# Patient Record
Sex: Female | Born: 1970 | Race: White | Hispanic: No | Marital: Married | Smoking: Never smoker
Health system: Southern US, Community
[De-identification: ages and names within clinical notes are randomized; demographics above are authoritative.]

## PROBLEM LIST (undated history)

## (undated) DIAGNOSIS — E669 Obesity, unspecified: Secondary | ICD-10-CM

## (undated) DIAGNOSIS — J9383 Other pneumothorax: Secondary | ICD-10-CM

## (undated) DIAGNOSIS — R011 Cardiac murmur, unspecified: Secondary | ICD-10-CM

## (undated) DIAGNOSIS — R112 Nausea with vomiting, unspecified: Secondary | ICD-10-CM

## (undated) DIAGNOSIS — Z8742 Personal history of other diseases of the female genital tract: Secondary | ICD-10-CM

## (undated) DIAGNOSIS — T8859XA Other complications of anesthesia, initial encounter: Secondary | ICD-10-CM

## (undated) DIAGNOSIS — R42 Dizziness and giddiness: Secondary | ICD-10-CM

## (undated) DIAGNOSIS — T884XXA Failed or difficult intubation, initial encounter: Secondary | ICD-10-CM

## (undated) DIAGNOSIS — J302 Other seasonal allergic rhinitis: Secondary | ICD-10-CM

## (undated) DIAGNOSIS — Z9889 Other specified postprocedural states: Secondary | ICD-10-CM

## (undated) DIAGNOSIS — T4145XA Adverse effect of unspecified anesthetic, initial encounter: Secondary | ICD-10-CM

## (undated) HISTORY — PX: LUNG SURGERY: SHX703

## (undated) HISTORY — DX: Cardiac murmur, unspecified: R01.1

## (undated) HISTORY — DX: Other pneumothorax: J93.83

## (undated) HISTORY — DX: Personal history of other diseases of the female genital tract: Z87.42

## (undated) HISTORY — DX: Obesity, unspecified: E66.9

## (undated) HISTORY — DX: Other seasonal allergic rhinitis: J30.2

## (undated) SURGERY — Surgical Case
Anesthesia: *Unknown

---
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
## 2010-04-18 HISTORY — PX: ABLATION ON ENDOMETRIOSIS: SHX5787

## 2010-08-18 DIAGNOSIS — N921 Excessive and frequent menstruation with irregular cycle: Secondary | ICD-10-CM | POA: Insufficient documentation

## 2012-03-07 ENCOUNTER — Ambulatory Visit: Payer: Self-pay | Admitting: Nurse Practitioner

## 2013-04-15 ENCOUNTER — Ambulatory Visit: Payer: Self-pay | Admitting: Nurse Practitioner

## 2013-11-25 DIAGNOSIS — R42 Dizziness and giddiness: Secondary | ICD-10-CM | POA: Insufficient documentation

## 2013-11-25 DIAGNOSIS — K219 Gastro-esophageal reflux disease without esophagitis: Secondary | ICD-10-CM | POA: Insufficient documentation

## 2014-05-02 ENCOUNTER — Ambulatory Visit: Payer: Self-pay | Admitting: Family Medicine

## 2014-12-26 LAB — HEPATIC FUNCTION PANEL
ALT: 14 (ref 7–35)
AST: 17 (ref 13–35)

## 2014-12-26 LAB — BASIC METABOLIC PANEL
BUN: 11 (ref 4–21)
CREATININE: 0.8 (ref <=1.1)
GLUCOSE: 82
Potassium: 4.4 (ref 3.4–5.3)
SODIUM: 140 (ref 137–147)

## 2014-12-26 LAB — LIPID PANEL
Cholesterol: 161 (ref 0–200)
HDL: 45 (ref 35–70)
LDL Cholesterol: 108
Triglycerides: 39 — AB (ref 40–160)

## 2014-12-26 LAB — HM PAP SMEAR: HM Pap smear: NORMAL

## 2015-07-24 DIAGNOSIS — Z1283 Encounter for screening for malignant neoplasm of skin: Secondary | ICD-10-CM | POA: Diagnosis not present

## 2015-07-24 DIAGNOSIS — D239 Other benign neoplasm of skin, unspecified: Secondary | ICD-10-CM | POA: Diagnosis not present

## 2015-07-24 DIAGNOSIS — D229 Melanocytic nevi, unspecified: Secondary | ICD-10-CM | POA: Diagnosis not present

## 2015-07-24 DIAGNOSIS — D485 Neoplasm of uncertain behavior of skin: Secondary | ICD-10-CM | POA: Diagnosis not present

## 2015-09-16 DIAGNOSIS — J029 Acute pharyngitis, unspecified: Secondary | ICD-10-CM | POA: Diagnosis not present

## 2016-03-17 ENCOUNTER — Ambulatory Visit (INDEPENDENT_AMBULATORY_CARE_PROVIDER_SITE_OTHER): Payer: BLUE CROSS/BLUE SHIELD | Admitting: Physician Assistant

## 2016-03-17 ENCOUNTER — Encounter: Payer: Self-pay | Admitting: Physician Assistant

## 2016-03-17 VITALS — BP 128/80 | HR 72 | Temp 98.2°F | Resp 16 | Ht 64.0 in | Wt 183.0 lb

## 2016-03-17 DIAGNOSIS — F331 Major depressive disorder, recurrent, moderate: Secondary | ICD-10-CM | POA: Diagnosis not present

## 2016-03-17 DIAGNOSIS — Z1239 Encounter for other screening for malignant neoplasm of breast: Secondary | ICD-10-CM

## 2016-03-17 DIAGNOSIS — Z1231 Encounter for screening mammogram for malignant neoplasm of breast: Secondary | ICD-10-CM | POA: Diagnosis not present

## 2016-03-17 DIAGNOSIS — Z7689 Persons encountering health services in other specified circumstances: Secondary | ICD-10-CM | POA: Diagnosis not present

## 2016-03-17 MED ORDER — ESCITALOPRAM OXALATE 10 MG PO TABS: ORAL_TABLET | ORAL | 0 refills | Status: DC

## 2016-03-17 NOTE — Progress Notes (Signed)
Patient: Tracy Booth Female    DOB: 11-22-1970   45 y.o.   MRN: [id number] Visit Date: 03/17/2016  Today's Provider: Mar Daring, PA-C   Chief Complaint  Patient presents with  . New Patient (Initial Visit)  . Depression   Subjective:     Patient comes in today to establish care with the practice. Patient was formerly a patient of [facility] in [place] (Dr. Baldemar Lenis).   Depression         This is a recurrent problem.  The onset quality is gradual.   The problem occurs daily.  The problem has been gradually worsening since onset.  Associated symptoms include fatigue, insomnia, appetite change and sad.  Associated symptoms include no suicidal ideas.     The symptoms are aggravated by family issues.  Past treatments include nothing.  No Known Allergies  No current outpatient prescriptions on file.  Review of Systems  Constitutional: Positive for appetite change and fatigue. Negative for activity change, chills, diaphoresis, fever and unexpected weight change.  HENT: Negative.   Eyes: Negative.   Respiratory: Negative.   Cardiovascular: Negative.   Gastrointestinal: Negative.   Endocrine: Negative.   Genitourinary: Negative.   Musculoskeletal: Negative.   Skin: Negative.   Allergic/Immunologic: Positive for environmental allergies.  Neurological: Positive for dizziness.  Hematological: Negative.   Psychiatric/Behavioral: Positive for agitation, depression and sleep disturbance. Negative for suicidal ideas. The patient is nervous/anxious and has insomnia.     Social History  Substance Use Topics  . Smoking status: Never Smoker  . Smokeless tobacco: Not on file  . Alcohol use No   Objective:   BP 128/80 (BP Location: Left Arm, Patient Position: Sitting, Cuff Size: Normal)   Pulse 72   Temp 98.2 F (36.8 C)   Resp 16   Ht 5\' 4"  (1.626 m)   Wt 183 lb (83 kg)   LMP  (LMP Unknown)   BMI 31.41 kg/m   Physical Exam  Constitutional: She is  oriented to person, place, and time. She appears well-developed and well-nourished. No distress.  HENT:  Head: Normocephalic and atraumatic.  Right Ear: External ear normal.  Left Ear: External ear normal.  Nose: Nose normal.  Mouth/Throat: Oropharynx is clear and moist. No oropharyngeal exudate.  Eyes: Conjunctivae and EOM are normal. Pupils are equal, round, and reactive to light. Right eye exhibits no discharge. Left eye exhibits no discharge. No scleral icterus.  Neck: Normal range of motion. Neck supple. No JVD present. No tracheal deviation present. No thyromegaly present.  Cardiovascular: Normal rate, regular rhythm, normal heart sounds and intact distal pulses.  Exam reveals no gallop and no friction rub.   No murmur heard. Pulmonary/Chest: Effort normal and breath sounds normal. No respiratory distress. She has no wheezes. She has no rales. She exhibits no tenderness.  Abdominal: Soft. Bowel sounds are normal. She exhibits no distension and no mass. There is no tenderness. There is no rebound and no guarding.  Musculoskeletal: Normal range of motion. She exhibits no edema or tenderness.  Lymphadenopathy:    She has no cervical adenopathy.  Neurological: She is alert and oriented to person, place, and time.  Skin: Skin is warm and dry. No rash noted. She is not diaphoretic.  Psychiatric: Her speech is normal and behavior is normal. Judgment and thought content normal. Her mood appears anxious. Cognition and memory are normal. She exhibits a depressed mood.  Tearful, noticing strain on marriage  Vitals reviewed.  Assessment & Plan:     1. Establishing care with new doctor, encounter for Establishing from Dr. Baldemar Lenis, [facility]. Notes, labs and pap from 12/2014 are all visible and were reviewed today. Patient did have labs in the last 2 weeks for insurance biometric screen. These were not available.  2. Moderate episode of recurrent major depressive disorder  (HCC) Worsening. Will add lexapro as below. She and her husband are starting counseling next week. I will see her back in 4 weeks to see how she is doing with this medication. - escitalopram (LEXAPRO) 10 MG tablet; Take 1/2 tab PO q h.s. X 1 week, then increase to 1 tab PO q h.s.  Dispense: 30 tablet; Refill: 0  3. Breast cancer screening Mammogram ordered as below due to her missing her mammogram in January of this year. - MM Digital Screening; Future  The entirety of the information documented in the History of Present Illness, Review of Systems and Physical Exam were personally obtained by me. Portions of this information were initially documented by Wilburt Finlay, CMA and reviewed by me for thoroughness and accuracy.      Mar Daring, PA-C  [facility]

## 2016-03-17 NOTE — Patient Instructions (Signed)
Escitalopram tablets What is this medicine? ESCITALOPRAM (es sye TAL oh pram) is used to treat depression and certain types of anxiety. This medicine may be used for other purposes; ask your health care provider or pharmacist if you have questions. COMMON BRAND NAME(S): Lexapro What should I tell my health care provider before I take this medicine? They need to know if you have any of these conditions: -bipolar disorder or a family history of bipolar disorder -diabetes -glaucoma -heart disease -kidney or liver disease -receiving electroconvulsive therapy -seizures (convulsions) -suicidal thoughts, plans, or attempt by you or a family member -an unusual or allergic reaction to escitalopram, the related drug citalopram, other medicines, foods, dyes, or preservatives -pregnant or trying to become pregnant -breast-feeding How should I use this medicine? Take this medicine by mouth with a glass of water. Follow the directions on the prescription label. You can take it with or without food. If it upsets your stomach, take it with food. Take your medicine at regular intervals. Do not take it more often than directed. Do not stop taking this medicine suddenly except upon the advice of your doctor. Stopping this medicine too quickly may cause serious side effects or your condition may worsen. A special MedGuide will be given to you by the pharmacist with each prescription and refill. Be sure to read this information carefully each time. Talk to your pediatrician regarding the use of this medicine in children. Special care may be needed. Overdosage: If you think you have taken too much of this medicine contact a poison control center or emergency room at once. NOTE: This medicine is only for you. Do not share this medicine with others. What if I miss a dose? If you miss a dose, take it as soon as you can. If it is almost time for your next dose, take only that dose. Do not take double or extra  doses. What may interact with this medicine? Do not take this medicine with any of the following medications: -certain medicines for fungal infections like fluconazole, itraconazole, ketoconazole, posaconazole, voriconazole -cisapride -citalopram -dofetilide -dronedarone -linezolid -MAOIs like Carbex, Eldepryl, Marplan, Nardil, and Parnate -methylene blue (injected into a vein) -pimozide -thioridazine -ziprasidone This medicine may also interact with the following medications: -alcohol -amphetamines -aspirin and aspirin-like medicines -carbamazepine -certain medicines for depression, anxiety, or psychotic disturbances -certain medicines for migraine headache like almotriptan, eletriptan, frovatriptan, naratriptan, rizatriptan, sumatriptan, zolmitriptan -certain medicines for sleep -certain medicines that treat or prevent blood clots like warfarin, enoxaparin, dalteparin -cimetidine -diuretics -fentanyl -furazolidone -isoniazid -lithium -metoprolol -NSAIDs, medicines for pain and inflammation, like ibuprofen or naproxen -other medicines that prolong the QT interval (cause an abnormal heart rhythm) -procarbazine -rasagiline -supplements like St. John's wort, kava kava, valerian -tramadol -tryptophan This list may not describe all possible interactions. Give your health care provider a list of all the medicines, herbs, non-prescription drugs, or dietary supplements you use. Also tell them if you smoke, drink alcohol, or use illegal drugs. Some items may interact with your medicine. What should I watch for while using this medicine? Tell your doctor if your symptoms do not get better or if they get worse. Visit your doctor or health care professional for regular checks on your progress. Because it may take several weeks to see the full effects of this medicine, it is important to continue your treatment as prescribed by your doctor. Patients and their families should watch out for  new or worsening thoughts of suicide or depression. Also watch out for   sudden changes in feelings such as feeling anxious, agitated, panicky, irritable, hostile, aggressive, impulsive, severely restless, overly excited and hyperactive, or not being able to sleep. If this happens, especially at the beginning of treatment or after a change in dose, call your health care professional. Dennis Bast may get drowsy or dizzy. Do not drive, use machinery, or do anything that needs mental alertness until you know how this medicine affects you. Do not stand or sit up quickly, especially if you are an older patient. This reduces the risk of dizzy or fainting spells. Alcohol may interfere with the effect of this medicine. Avoid alcoholic drinks. Your mouth may get dry. Chewing sugarless gum or sucking hard candy, and drinking plenty of water may help. Contact your doctor if the problem does not go away or is severe. What side effects may I notice from receiving this medicine? Side effects that you should report to your doctor or health care professional as soon as possible: -allergic reactions like skin rash, itching or hives, swelling of the face, lips, or tongue -anxious -black, tarry stools -changes in vision -confusion -elevated mood, decreased need for sleep, racing thoughts, impulsive behavior -eye pain -fast, irregular heartbeat -feeling faint or lightheaded, falls -feeling agitated, angry, or irritable -hallucination, loss of contact with reality -loss of balance or coordination -loss of memory -painful or prolonged erections -restlessness, pacing, inability to keep still -seizures -stiff muscles -suicidal thoughts or other mood changes -trouble sleeping -unusual bleeding or bruising -unusually weak or tired -vomiting Side effects that usually do not require medical attention (report to your doctor or health care professional if they continue or are bothersome): -changes in appetite -change in sex  drive or performance -headache -increased sweating -indigestion, nausea -tremors This list may not describe all possible side effects. Call your doctor for medical advice about side effects. You may report side effects to FDA at 1-800-FDA-1088. Where should I keep my medicine? Keep out of reach of children. Store at room temperature between 15 and 30 degrees C (59 and 86 degrees F). Throw away any unused medicine after the expiration date. NOTE: This sheet is a summary. It may not cover all possible information. If you have questions about this medicine, talk to your doctor, pharmacist, or health care provider.  2017 Elsevier/Gold Standard (2015-09-07 13:20:23)

## 2016-04-15 ENCOUNTER — Encounter: Payer: Self-pay | Admitting: Physician Assistant

## 2016-04-15 ENCOUNTER — Ambulatory Visit (INDEPENDENT_AMBULATORY_CARE_PROVIDER_SITE_OTHER): Payer: BLUE CROSS/BLUE SHIELD | Admitting: Physician Assistant

## 2016-04-15 VITALS — BP 106/80 | HR 64 | Temp 98.1°F | Resp 16 | Wt 178.0 lb

## 2016-04-15 DIAGNOSIS — F3341 Major depressive disorder, recurrent, in partial remission: Secondary | ICD-10-CM | POA: Diagnosis not present

## 2016-04-15 MED ORDER — NORETHINDRONE ACETATE 5 MG PO TABS: 5.0000 mg | ORAL_TABLET | Freq: Every day | ORAL | 3 refills | Status: DC

## 2016-04-15 MED ORDER — ESCITALOPRAM OXALATE 10 MG PO TABS
10.0000 mg | ORAL_TABLET | Freq: Every day | ORAL | 0 refills | Status: DC
Start: 2016-04-15 — End: 2016-05-10

## 2016-04-15 NOTE — Progress Notes (Signed)
       Patient: Tracy Booth Female    DOB: 05/03/1970   45 y.o.   MRN: [id number] Visit Date: 04/15/2016  Today's Provider: Mar Daring, PA-C   Chief Complaint  Patient presents with  . Depression   Subjective:    Depression         Episode onset: FU from 03/17/2016. Pt was started on Lexapro at [place].   The problem has been gradually improving (pt reports she is 30-40% improved) since onset.  Associated symptoms include restlessness, body aches (lower back) and indigestion.  Associated symptoms include no decreased concentration, no fatigue, no helplessness, no hopelessness, does not have insomnia, not irritable, no decreased interest, no appetite change, no headaches, not sad and no suicidal ideas.  Past treatments include SSRIs - Selective serotonin reuptake inhibitors.  Compliance with treatment is good.  Previous treatment provided moderate relief.  Pt is also requesting refill of norethindrone.  No Known Allergies   Current Outpatient Prescriptions:  .  escitalopram (LEXAPRO) 10 MG tablet, Take 1/2 tab PO q h.s. X 1 week, then increase to 1 tab PO q h.s., Disp: 30 tablet, Rfl: 0 .  norethindrone (AYGESTIN) 5 MG tablet, Take 5 mg by mouth daily., Disp: , Rfl:   Review of Systems  Constitutional: Negative for appetite change and fatigue.  Respiratory: Negative for chest tightness and shortness of breath.   Cardiovascular: Negative for chest pain, palpitations and leg swelling.  Neurological: Negative for dizziness and headaches.  Psychiatric/Behavioral: Positive for depression. Negative for decreased concentration, self-injury, sleep disturbance and suicidal ideas. The patient is not nervous/anxious and does not have insomnia.     Social History  Substance Use Topics  . Smoking status: Never Smoker  . Smokeless tobacco: Never Used  . Alcohol use Yes     Comment: rare   Objective:   BP 106/80 (BP Location: Left Arm, Patient Position: Sitting, Cuff Size:  Large)   Pulse 64   Temp 98.1 F (36.7 C) (Oral)   Resp 16   Wt 178 lb (80.7 kg)   LMP  (LMP Unknown)   BMI 30.55 kg/m   Physical Exam  Constitutional: She appears well-developed and well-nourished. She is not irritable. No distress.  Neck: Normal range of motion. Neck supple.  Cardiovascular: Normal rate, regular rhythm and normal heart sounds.  Exam reveals no gallop and no friction rub.   No murmur heard. Pulmonary/Chest: Effort normal and breath sounds normal. No respiratory distress. She has no wheezes. She has no rales.  Skin: She is not diaphoretic.  Psychiatric: She has a normal mood and affect. Her behavior is normal. Judgment and thought content normal.  Vitals reviewed.      Assessment & Plan:     1. Recurrent major depressive disorder, in partial remission ([place]) Slowly improving. Will continue current dose. She is to call the office in 2-4 weeks to see if she is continuing to improve or of she would like to increase to 15mg  lexapro. If she is stable and improving I will se her back in 3 months to re-evaluate. - escitalopram (LEXAPRO) 10 MG tablet; Take 1 tablet (10 mg total) by mouth at bedtime.  Dispense: 90 tablet; Refill: 0     Patient seen and examined by Mar Daring, PA-C, and note scribed by [name], CMA.  Mar Daring, PA-C  [facility]

## 2016-04-15 NOTE — Patient Instructions (Signed)
Major Depressive Disorder, Adult Major depressive disorder (MDD) is a mental health condition. It may also be called clinical depression or unipolar depression. MDD usually causes feelings of sadness, hopelessness, or helplessness. MDD can also cause physical symptoms. It can interfere with work, school, relationships, and other everyday activities. MDD may be mild, moderate, or severe. It may occur once (single episode major depressive disorder) or it may occur multiple times (recurrent major depressive disorder). What are the causes? The exact cause of this condition is not known. MDD is most likely caused by a combination of things, which may include:  Genetic factors. These are traits that are passed along from parent to child.  Individual factors. Your personality, your behavior, and the way you handle your thoughts and feelings may contribute to MDD. This includes personality traits and behaviors learned from others.  Physical factors, such as: ? Differences in the part of your brain that controls emotion. This part of your brain may be different than it is in people who do not have MDD. ? Long-term (chronic) medical or psychiatric illnesses.  Social factors. Traumatic experiences or major life changes may play a role in the development of MDD.  What increases the risk? This condition is more likely to develop in women. The following factors may also make you more likely to develop MDD:  A family history of depression.  Troubled family relationships.  Abnormally low levels of certain brain chemicals.  Traumatic events in childhood, especially abuse or the loss of a parent.  Being under a lot of stress, or long-term stress, especially from upsetting life experiences or losses.  A history of: ? Chronic physical illness. ? Other mental health disorders. ? Substance abuse.  Poor living conditions.  Experiencing social exclusion or discrimination on a regular basis.  What are  the signs or symptoms? The main symptoms of MDD typically include:  Constant depressed or irritable mood.  Loss of interest in things and activities.  MDD symptoms may also include:  Sleeping or eating too much or too little.  Unexplained weight change.  Fatigue or low energy.  Feelings of worthlessness or guilt.  Difficulty thinking clearly or making decisions.  Thoughts of suicide or of harming others.  Physical agitation or weakness.  Isolation.  Severe cases of MDD may also occur with other symptoms, such as:  Delusions or hallucinations, in which you imagine things that are not real (psychotic depression).  Low-level depression that lasts at least a year (chronic depression or persistent depressive disorder).  Extreme sadness and hopelessness (melancholic depression).  Trouble speaking and moving (catatonic depression).  How is this diagnosed? This condition may be diagnosed based on:  Your symptoms.  Your medical history, including your mental health history. This may involve tests to evaluate your mental health. You may be asked questions about your lifestyle, including any drug and alcohol use, and how long you have had symptoms of MDD.  A physical exam.  Blood tests to rule out other conditions.  You must have a depressed mood and at least four other MDD symptoms most of the day, nearly every day in the same 2-week timeframe before your health care provider can confirm a diagnosis of MDD. How is this treated? This condition is usually treated by mental health professionals, such as psychologists, psychiatrists, and clinical social workers. You may need more than one type of treatment. Treatment may include:  Psychotherapy. This is also called talk therapy or counseling. Types of psychotherapy include: ? Cognitive behavioral   therapy (CBT). This type of therapy teaches you to recognize unhealthy feelings, thoughts, and behaviors, and replace them with  positive thoughts and actions. ? Interpersonal therapy (IPT). This helps you to improve the way you relate to and communicate with others. ? Family therapy. This treatment includes members of your family.  Medicine to treat anxiety and depression, or to help you control certain emotions and behaviors.  Lifestyle changes, such as: ? Limiting alcohol and drug use. ? Exercising regularly. ? Getting plenty of sleep. ? Making healthy eating choices. ? Spending more time outdoors.  Treatments involving stimulation of the brain can be used in situations with extremely severe symptoms, or when medicine or other therapies do not work over time. These treatments include electroconvulsive therapy, transcranial magnetic stimulation, and vagal nerve stimulation. Follow these instructions at home: Activity  Return to your normal activities as told by your health care provider.  Exercise regularly and spend time outdoors as told by your health care provider. General instructions  Take over-the-counter and prescription medicines only as told by your health care provider.  Do not drink alcohol. If you drink alcohol, limit your alcohol intake to no more than 1 drink a day for nonpregnant women and 2 drinks a day for men. One drink equals 12 oz of beer, 5 oz of wine, or 1 oz of hard liquor. Alcohol can affect any antidepressant medicines you are taking. Talk to your health care provider about your alcohol use.  Eat a healthy diet and get plenty of sleep.  Find activities that you enjoy doing, and make time to do them.  Consider joining a support group. Your health care provider may be able to recommend a support group.  Keep all follow-up visits as told by your health care provider. This is important. Where to find more information: National Alliance on Mental Illness  www.nami.org  U.S. National Institute of Mental Health  www.nimh.nih.gov  National Suicide Prevention  Lifeline  1-800-273-TALK (8255). This is free, 24-hour help.  Contact a health care provider if:  Your symptoms get worse.  You develop new symptoms. Get help right away if:  You self-harm.  You have serious thoughts about hurting yourself or others.  You see, hear, taste, smell, or feel things that are not present (hallucinate). This information is not intended to replace advice given to you by your health care provider. Make sure you discuss any questions you have with your health care provider. Document Released: 07/30/2012 Document Revised: 12/10/2015 Document Reviewed: 10/14/2015 Elsevier Interactive Patient Education  2017 Elsevier Inc.  

## 2016-04-29 ENCOUNTER — Ambulatory Visit
Admission: RE | Admit: 2016-04-29 | Discharge: 2016-04-29 | Disposition: A | Discharge status: Home / Self Care | Payer: BLUE CROSS/BLUE SHIELD | Source: Ambulatory Visit | Attending: Physician Assistant | Admitting: Physician Assistant

## 2016-04-29 ENCOUNTER — Telehealth: Payer: Self-pay

## 2016-04-29 DIAGNOSIS — Z1231 Encounter for screening mammogram for malignant neoplasm of breast: Secondary | ICD-10-CM | POA: Diagnosis not present

## 2016-04-29 DIAGNOSIS — Z1239 Encounter for other screening for malignant neoplasm of breast: Secondary | ICD-10-CM

## 2016-04-29 NOTE — Telephone Encounter (Signed)
Pt advised. Tracy Booth, CMA  

## 2016-04-29 NOTE — Telephone Encounter (Signed)
-----   Message from Mar Daring, [place] sent at 04/29/2016 12:27 PM EST ----- Normal mammogram. Repeat screening in one year.

## 2016-05-10 ENCOUNTER — Telehealth: Payer: Self-pay

## 2016-05-10 DIAGNOSIS — F3341 Major depressive disorder, recurrent, in partial remission: Secondary | ICD-10-CM

## 2016-05-10 MED ORDER — ESCITALOPRAM OXALATE 10 MG PO TABS
10.0000 mg | ORAL_TABLET | Freq: Every day | ORAL | 3 refills | Status: DC
Start: 2016-05-10 — End: 2017-03-17

## 2016-05-10 NOTE — Telephone Encounter (Signed)
Sent in future fills for her to [facility]

## 2016-05-10 NOTE — Telephone Encounter (Signed)
Pt called stating you wanted an update on her Lexapro. Pt states she is doing well, and likes the dose that she is on currently. [name], CMA

## 2016-12-13 ENCOUNTER — Encounter: Payer: Self-pay | Admitting: Physician Assistant

## 2017-03-17 ENCOUNTER — Ambulatory Visit (INDEPENDENT_AMBULATORY_CARE_PROVIDER_SITE_OTHER): Payer: BLUE CROSS/BLUE SHIELD | Admitting: Physician Assistant

## 2017-03-17 ENCOUNTER — Encounter: Payer: Self-pay | Admitting: Physician Assistant

## 2017-03-17 VITALS — BP 146/90 | HR 72 | Resp 16 | Ht 64.0 in | Wt 205.2 lb

## 2017-03-17 DIAGNOSIS — Z1231 Encounter for screening mammogram for malignant neoplasm of breast: Secondary | ICD-10-CM | POA: Diagnosis not present

## 2017-03-17 DIAGNOSIS — Z114 Encounter for screening for human immunodeficiency virus [HIV]: Secondary | ICD-10-CM | POA: Diagnosis not present

## 2017-03-17 DIAGNOSIS — R011 Cardiac murmur, unspecified: Secondary | ICD-10-CM | POA: Diagnosis not present

## 2017-03-17 DIAGNOSIS — R030 Elevated blood-pressure reading, without diagnosis of hypertension: Secondary | ICD-10-CM

## 2017-03-17 DIAGNOSIS — Z0000 Encounter for general adult medical examination without abnormal findings: Secondary | ICD-10-CM | POA: Diagnosis not present

## 2017-03-17 DIAGNOSIS — F3341 Major depressive disorder, recurrent, in partial remission: Secondary | ICD-10-CM

## 2017-03-17 DIAGNOSIS — Z1239 Encounter for other screening for malignant neoplasm of breast: Secondary | ICD-10-CM

## 2017-03-17 MED ORDER — NORETHINDRONE ACETATE 5 MG PO TABS: 5.0000 mg | ORAL_TABLET | Freq: Every day | ORAL | 3 refills | Status: DC

## 2017-03-17 MED ORDER — ESCITALOPRAM OXALATE 10 MG PO TABS: 10.0000 mg | ORAL_TABLET | Freq: Every day | ORAL | 3 refills | Status: DC

## 2017-03-17 NOTE — Progress Notes (Addendum)
Patient: Tracy Booth, Female    DOB: 1971-02-22, 46 y.o.   MRN: [id number] Visit Date: 03/17/2017  Today's Provider: Mar Daring, PA-C   Chief Complaint  Patient presents with  . Annual Exam   Subjective:    Annual physical exam Tracy Booth is a 46 y.o. female who presents today for health maintenance and complete physical. She feels well. She reports exercising 3 days, 45 min classes. She reports she is sleeping well.  12/26/14 Pap-normal 04/29/16 Mammogram-BI-RADS 1 ----------------------------------------------------------------- Patient reports she did have a "bad" headache last night patient reports taking Extra Strength Tylenol and reports symptoms improved.  Review of Systems  Constitutional: Negative.   HENT: Negative.   Eyes: Negative.   Respiratory: Negative.   Cardiovascular: Negative.   Gastrointestinal: Negative.   Endocrine: Positive for cold intolerance (always cold) and polyuria (due to increased water consumption).  Genitourinary: Negative.   Musculoskeletal: Negative.   Skin: Negative.   Allergic/Immunologic: Negative.   Neurological: Negative.   Hematological: Negative.   Psychiatric/Behavioral: Negative.     Social History      She  reports that  has never smoked. she has never used smokeless tobacco. She reports that she drinks alcohol. She reports that she does not use drugs.       Social History   Socioeconomic History  . Marital status: Married    Spouse name: None  . Number of children: None  . Years of education: None  . Highest education level: None  Social Needs  . Financial resource strain: None  . Food insecurity - worry: None  . Food insecurity - inability: None  . Transportation needs - medical: None  . Transportation needs - non-medical: None  Occupational History  . None  Tobacco Use  . Smoking status: Never Smoker  . Smokeless tobacco: Never Used  Substance and Sexual Activity  .  Alcohol use: Yes    Comment: rare  . Drug use: No  . Sexual activity: None  Other Topics Concern  . None  Social History Narrative  . None    Past Medical History:  Diagnosis Date  . Heart murmur   . History of infertility   . Obesity   . Seasonal allergies   . Spontaneous pneumothorax      Patient Active Problem List   Diagnosis Date Noted  . Vertigo 11/25/2013  . Esophageal reflux 11/25/2013  . Metrorrhagia 08/18/2010    Past Surgical History:  Procedure Laterality Date  . ABLATION ON ENDOMETRIOSIS  2012  . LUNG SURGERY Left 1997 and 1999  . [place] History        Family Status  Relation Name Status  . Mother  Alive  . Father  Alive  . MGM  (Not Specified)  . Brother  Alive  . Son  Alive        Her family history includes Arthritis in her mother; Breast cancer in her maternal grandmother; Fibromyalgia in her mother; Hypertension in her mother.     No Known Allergies   Current Outpatient Medications:  .  escitalopram (LEXAPRO) 10 MG tablet, Take 1 tablet (10 mg total) by mouth at bedtime., Disp: 90 tablet, Rfl: 3 .  norethindrone (AYGESTIN) 5 MG tablet, Take 1 tablet (5 mg total) by mouth daily., Disp: 90 tablet, Rfl: 3   Patient Care Team: Rubye Beach as PCP - General (Family Medicine)  Objective:   Vitals: BP (!) 146/90 (BP Location: Left Arm, Patient Position: Sitting, Cuff Size: Large)   Pulse 72   Resp 16   Ht 5\' 4"  (1.626 m)   Wt 205 lb 3.2 oz (93.1 kg)   SpO2 98%   BMI 35.22 kg/m    Vitals:   03/17/17 1421  BP: (!) 146/90  Pulse: 72  Resp: 16  SpO2: 98%  Weight: 205 lb 3.2 oz (93.1 kg)  Height: 5\' 4"  (1.626 m)     Physical Exam  Constitutional: She is oriented to person, place, and time. She appears well-developed and well-nourished. No distress.  HENT:  Head: Normocephalic and atraumatic.  Right Ear: Hearing, tympanic membrane, external ear and ear canal normal.  Left Ear: Hearing,  tympanic membrane, external ear and ear canal normal.  Nose: Nose normal.  Mouth/Throat: Uvula is midline, oropharynx is clear and moist and mucous membranes are normal. No oropharyngeal exudate.  Eyes: Conjunctivae and EOM are normal. Pupils are equal, round, and reactive to light. Right eye exhibits no discharge. Left eye exhibits no discharge. No scleral icterus.  Neck: Normal range of motion. Neck supple. No JVD present. No tracheal deviation present. No thyromegaly present.  Cardiovascular: Normal rate, regular rhythm and intact distal pulses. Exam reveals no gallop and no friction rub.  Murmur heard.  Systolic murmur is present with a grade of 2/6. Pulmonary/Chest: Effort normal and breath sounds normal. No respiratory distress. She has no wheezes. She has no rales. She exhibits no tenderness.  Abdominal: Soft. Bowel sounds are normal. She exhibits no distension and no mass. There is no tenderness. There is no rebound and no guarding.  Musculoskeletal: Normal range of motion. She exhibits no edema or tenderness.  Lymphadenopathy:    She has no cervical adenopathy.  Neurological: She is alert and oriented to person, place, and time.  Skin: Skin is warm and dry. No rash noted. She is not diaphoretic.  Psychiatric: She has a normal mood and affect. Her behavior is normal. Judgment and thought content normal.  Vitals reviewed.    Depression Screen PHQ 2/9 Scores 03/17/2017  PHQ - 2 Score 0  PHQ- 9 Score 2      Assessment & Plan:     Routine Health Maintenance and Physical Exam  Exercise Activities and Dietary recommendations Goals    None      Immunization History  Administered Date(s) Administered  . Tdap 11/25/2012    Health Maintenance  Topic Date Due  . HIV Screening  08/14/1983  . INFLUENZA VACCINE  08/15/2017 (Originally 11/16/2016)  . PAP SMEAR  12/25/2017  . TETANUS/TDAP  11/26/2022    Discussed health benefits of physical activity, and encouraged her to  engage in regular exercise appropriate for her age and condition.    1. Annual physical exam Normal physical exam today. Will check labs as below and f/u pending lab results. If labs are stable and WNL she will not need to have these rechecked for one year at her next annual physical exam. She is to call the office in the meantime if she has any acute issue, questions or concerns. - CBC w/Diff/Platelet - COMPLETE METABOLIC PANEL WITH GFR - TSH - Lipid Profile - HgB A1c  2. Breast cancer screening There is family history of breast cancer in her maternal grandmother. She does perform regular self breast exams. Mammogram was ordered as below. Information for [facility] clinic was given to patient so she may schedule her mammogram at  her convenience. - MM Digital Screening; Future  3. Recurrent major depressive disorder, in partial remission (HCC) Stable. Diagnosis pulled for medication refill. Continue current medical treatment plan. - escitalopram (LEXAPRO) 10 MG tablet; Take 1 tablet (10 mg total) by mouth at bedtime.  Dispense: 90 tablet; Refill: 3  4. Screening for HIV without presence of risk factors - HIV antibody (with reflex)  5. Elevated blood pressure reading Elevated today. She is to check at home and call if readings are still elevated.   6. Cardiac murmur Patient reports she has had a murmur in the past, but it has never been evaluated. If BP is still elevated may consider cardiology referral.   --------------------------------------------------------------------    Mar Daring, PA-C  [place]

## 2017-03-17 NOTE — Patient Instructions (Signed)

## 2017-03-24 DIAGNOSIS — Z0000 Encounter for general adult medical examination without abnormal findings: Secondary | ICD-10-CM | POA: Diagnosis not present

## 2017-03-24 DIAGNOSIS — Z114 Encounter for screening for human immunodeficiency virus [HIV]: Secondary | ICD-10-CM | POA: Diagnosis not present

## 2017-03-25 LAB — CBC WITH DIFFERENTIAL/PLATELET
Basophils Absolute: 41 cells/uL (ref 0–200)
Basophils Relative: 0.7 %
EOS ABS: 70 {cells}/uL (ref 15–500)
Eosinophils Relative: 1.2 %
HEMATOCRIT: 39.9 % (ref 35.0–45.0)
Hemoglobin: 13.9 g/dL (ref 11.7–15.5)
LYMPHS ABS: 1201 {cells}/uL (ref 850–3900)
MCH: 31.2 pg (ref 27.0–33.0)
MCHC: 34.8 g/dL (ref 32.0–36.0)
MCV: 89.7 fL (ref 80.0–100.0)
MPV: 9.7 fL (ref 7.5–12.5)
Monocytes Relative: 7.4 %
NEUTROS PCT: 70 %
Neutro Abs: 4060 cells/uL (ref 1500–7800)
Platelets: 286 10*3/uL (ref 140–400)
RBC: 4.45 10*6/uL (ref 3.80–5.10)
RDW: 12.1 % (ref 11.0–15.0)
Total Lymphocyte: 20.7 %
WBC: 5.8 10*3/uL (ref 3.8–10.8)
WBCMIX: 429 {cells}/uL (ref 200–950)

## 2017-03-25 LAB — COMPLETE METABOLIC PANEL WITH GFR
AG RATIO: 1.4 (calc) (ref 1.0–2.5)
ALT: 15 U/L (ref 6–29)
AST: 18 U/L (ref 10–35)
Albumin: 3.6 g/dL (ref 3.6–5.1)
Alkaline phosphatase (APISO): 55 U/L (ref 33–115)
BUN: 10 mg/dL (ref 7–25)
CALCIUM: 8.8 mg/dL (ref 8.6–10.2)
CO2: 23 mmol/L (ref 20–32)
Chloride: 108 mmol/L (ref 98–110)
Creat: 0.87 mg/dL (ref 0.50–1.10)
GFR, EST AFRICAN AMERICAN: 91 mL/min/{1.73_m2} (ref >=60)
GFR, EST NON AFRICAN AMERICAN: 79 mL/min/{1.73_m2} (ref >=60)
GLOBULIN: 2.5 g/dL (ref 1.9–3.7)
Glucose, Bld: 82 mg/dL (ref 65–99)
POTASSIUM: 4.4 mmol/L (ref 3.5–5.3)
SODIUM: 139 mmol/L (ref 135–146)
TOTAL PROTEIN: 6.1 g/dL (ref 6.1–8.1)
Total Bilirubin: 0.5 mg/dL (ref 0.2–1.2)

## 2017-03-25 LAB — LIPID PANEL
CHOL/HDL RATIO: 3.4 (calc) (ref <5.0)
Cholesterol: 193 mg/dL (ref <200)
HDL: 57 mg/dL (ref >50)
LDL Cholesterol (Calc): 122 mg/dL (calc) — ABNORMAL HIGH
NON-HDL CHOLESTEROL (CALC): 136 mg/dL — AB (ref <130)
Triglycerides: 50 mg/dL (ref <150)

## 2017-03-25 LAB — HIV ANTIBODY (ROUTINE TESTING W REFLEX): HIV: NONREACTIVE

## 2017-03-25 LAB — TSH: TSH: 2.1 mIU/L

## 2017-03-25 LAB — HEMOGLOBIN A1C
HEMOGLOBIN A1C: 5.1 %{Hb} (ref <5.7)
Mean Plasma Glucose: 100 (calc)
eAG (mmol/L): 5.5 (calc)

## 2017-03-28 ENCOUNTER — Telehealth: Payer: Self-pay

## 2017-03-28 NOTE — Telephone Encounter (Signed)
Patient advised as directed below.  Thanks,  -Tracy Booth 

## 2017-03-28 NOTE — Telephone Encounter (Signed)
-----   Message from Mar Daring, [place] sent at 03/28/2017  1:17 PM EST ----- Cholesterol has increased some from 161 last year to 193 now. LDL up from 108 to 122. HDL improved also to 57 from 45. Higher HDL (good) offers cardioprotection. At this time no need for cholesterol lowering medications. Recommend lifestyle modifications with healthy dieting and increasing physical activity to get in 150 min moderate activity per week. All other labs are WNL and stable.

## 2017-05-12 ENCOUNTER — Ambulatory Visit
Admission: RE | Admit: 2017-05-12 | Discharge: 2017-05-12 | Disposition: A | Discharge status: Home / Self Care | Payer: BLUE CROSS/BLUE SHIELD | Source: Ambulatory Visit | Attending: Physician Assistant | Admitting: Physician Assistant

## 2017-05-12 DIAGNOSIS — Z1231 Encounter for screening mammogram for malignant neoplasm of breast: Secondary | ICD-10-CM | POA: Insufficient documentation

## 2017-05-12 DIAGNOSIS — Z1239 Encounter for other screening for malignant neoplasm of breast: Secondary | ICD-10-CM

## 2017-08-20 DIAGNOSIS — S62512A Displaced fracture of proximal phalanx of left thumb, initial encounter for closed fracture: Secondary | ICD-10-CM | POA: Diagnosis not present

## 2017-08-20 DIAGNOSIS — M79645 Pain in left finger(s): Secondary | ICD-10-CM | POA: Diagnosis not present

## 2017-08-22 DIAGNOSIS — S62512A Displaced fracture of proximal phalanx of left thumb, initial encounter for closed fracture: Secondary | ICD-10-CM | POA: Diagnosis not present

## 2017-08-22 DIAGNOSIS — S63642A Sprain of metacarpophalangeal joint of left thumb, initial encounter: Secondary | ICD-10-CM | POA: Diagnosis not present

## 2017-08-28 ENCOUNTER — Encounter
Admission: RE | Admit: 2017-08-28 | Discharge: 2017-08-28 | Disposition: A | Discharge status: Home / Self Care | Payer: BLUE CROSS/BLUE SHIELD | Source: Ambulatory Visit | Attending: Surgery | Admitting: Surgery

## 2017-08-28 ENCOUNTER — Other Ambulatory Visit: Payer: Self-pay

## 2017-08-28 DIAGNOSIS — Z01818 Encounter for other preprocedural examination: Secondary | ICD-10-CM | POA: Diagnosis not present

## 2017-08-28 HISTORY — DX: Other complications of anesthesia, initial encounter: T88.59XA

## 2017-08-28 HISTORY — DX: Nausea with vomiting, unspecified: R11.2

## 2017-08-28 HISTORY — DX: Adverse effect of unspecified anesthetic, initial encounter: T41.45XA

## 2017-08-28 HISTORY — DX: Other specified postprocedural states: Z98.890

## 2017-08-28 HISTORY — DX: Dizziness and giddiness: R42

## 2017-08-28 NOTE — Pre-Procedure Instructions (Signed)
PATIENT ENCOURAGED TO FOLLOW UP RE ELEVATED BP

## 2017-08-28 NOTE — Patient Instructions (Signed)
Your procedure is scheduled on: 08/31/17 Report to Day Surgery.[facility]OND FLOOR To find out your arrival time please call [phone] between 1PM - 3PM on 08/30/17  Remember: Instructions that are not followed completely may result in serious medical risk, up to and including death, or upon the discretion of your surgeon and anesthesiologist your surgery may need to be rescheduled.     _X__ 1. Do not eat food after midnight the night before your procedure.                 No gum chewing or hard candies. You may drink clear liquids up to 2 hours                 before you are scheduled to arrive for your surgery- DO not drink clear                 liquids within 2 hours of the start of your surgery.                 Clear Liquids include:  water, apple juice without pulp, clear carbohydrate                 drink such as Clearfast of Gartorade, Black Coffee or Tea (Do not add                 anything to coffee or tea).  __X__2.  On the morning of surgery brush your teeth with toothpaste and water, you                 may rinse your mouth with mouthwash if you wish.  Do not swallow any              toothpaste of mouthwash.     _X__ 3.  No Alcohol for 24 hours before or after surgery.   _X__ 4.  Do Not Smoke or use e-cigarettes For 24 Hours Prior to Your Surgery.                 Do not use any chewable tobacco products for at least 6 hours prior to                 surgery.  ____  5.  Bring all medications with you on the day of surgery if instructed.   ___X_  6.  Notify your doctor if there is any change in your medical condition      (cold, fever, infections).     Do not wear jewelry, make-up, hairpins, clips or nail polish. Do not wear lotions, powders, or perfumes. You may wear deodorant. Do not shave 48 hours prior to surgery. Men may shave face and neck. Do not bring valuables to the hospital.    [facility] is not responsible for any belongings or  valuables.  Contacts, dentures or bridgework may not be worn into surgery. Leave your suitcase in the car. After surgery it may be brought to your room. For patients admitted to the hospital, discharge time is determined by your treatment team.   Patients discharged the day of surgery will not be allowed to drive home.   Please read over the following fact sheets that you were given:   Surgical Site Infection Prevention          ____ Take these medicines the morning of surgery with A SIP OF WATER:    1. PEPCID AT BEDTIME 08/30/17 AND AM SURGER  2.  3.   4.  5.  6.  ____ Fleet Enema (as directed)   _X___ Use CHG Soap as directed  ____ Use inhalers on the day of surgery  ____ Stop metformin 2 days prior to surgery    ____ Take 1/2 of usual insulin dose the night before surgery. No insulin the morning          of surgery.   ____ Stop Coumadin/Plavix/aspirin on   ____ Stop Anti-inflammatories on    ____ Stop supplements until after surgery.    ____ Bring C-Pap to the hospital.

## 2017-08-31 ENCOUNTER — Ambulatory Visit
Admission: RE | Admit: 2017-08-31 | Discharge: 2017-08-31 | Disposition: A | Discharge status: Home / Self Care | Payer: BLUE CROSS/BLUE SHIELD | Source: Ambulatory Visit | Attending: Surgery | Admitting: Surgery

## 2017-08-31 ENCOUNTER — Ambulatory Visit: Payer: BLUE CROSS/BLUE SHIELD | Admitting: Certified Registered"

## 2017-08-31 ENCOUNTER — Other Ambulatory Visit: Payer: Self-pay

## 2017-08-31 ENCOUNTER — Encounter
Admission: RE | Disposition: A | Discharge status: Home / Self Care | Payer: Self-pay | Source: Ambulatory Visit | Attending: Surgery

## 2017-08-31 DIAGNOSIS — Z6835 Body mass index (BMI) 35.0-35.9, adult: Secondary | ICD-10-CM | POA: Insufficient documentation

## 2017-08-31 DIAGNOSIS — Z8249 Family history of ischemic heart disease and other diseases of the circulatory system: Secondary | ICD-10-CM | POA: Insufficient documentation

## 2017-08-31 DIAGNOSIS — Y9389 Activity, other specified: Secondary | ICD-10-CM | POA: Insufficient documentation

## 2017-08-31 DIAGNOSIS — Y9239 Other specified sports and athletic area as the place of occurrence of the external cause: Secondary | ICD-10-CM | POA: Insufficient documentation

## 2017-08-31 DIAGNOSIS — W1839XA Other fall on same level, initial encounter: Secondary | ICD-10-CM | POA: Diagnosis not present

## 2017-08-31 DIAGNOSIS — S63642A Sprain of metacarpophalangeal joint of left thumb, initial encounter: Secondary | ICD-10-CM | POA: Diagnosis not present

## 2017-08-31 DIAGNOSIS — Z820 Family history of epilepsy and other diseases of the nervous system: Secondary | ICD-10-CM | POA: Insufficient documentation

## 2017-08-31 DIAGNOSIS — W1830XA Fall on same level, unspecified, initial encounter: Secondary | ICD-10-CM | POA: Diagnosis not present

## 2017-08-31 DIAGNOSIS — Z803 Family history of malignant neoplasm of breast: Secondary | ICD-10-CM | POA: Insufficient documentation

## 2017-08-31 DIAGNOSIS — E669 Obesity, unspecified: Secondary | ICD-10-CM | POA: Insufficient documentation

## 2017-08-31 DIAGNOSIS — Z79899 Other long term (current) drug therapy: Secondary | ICD-10-CM | POA: Insufficient documentation

## 2017-08-31 DIAGNOSIS — Z8489 Family history of other specified conditions: Secondary | ICD-10-CM | POA: Insufficient documentation

## 2017-08-31 DIAGNOSIS — R011 Cardiac murmur, unspecified: Secondary | ICD-10-CM | POA: Diagnosis not present

## 2017-08-31 DIAGNOSIS — Z811 Family history of alcohol abuse and dependence: Secondary | ICD-10-CM | POA: Diagnosis not present

## 2017-08-31 DIAGNOSIS — Z833 Family history of diabetes mellitus: Secondary | ICD-10-CM | POA: Diagnosis not present

## 2017-08-31 DIAGNOSIS — S53442A Ulnar collateral ligament sprain of left elbow, initial encounter: Secondary | ICD-10-CM | POA: Insufficient documentation

## 2017-08-31 DIAGNOSIS — K219 Gastro-esophageal reflux disease without esophagitis: Secondary | ICD-10-CM | POA: Diagnosis not present

## 2017-08-31 DIAGNOSIS — S63682A Other sprain of left thumb, initial encounter: Secondary | ICD-10-CM | POA: Diagnosis not present

## 2017-08-31 DIAGNOSIS — S62512A Displaced fracture of proximal phalanx of left thumb, initial encounter for closed fracture: Secondary | ICD-10-CM | POA: Diagnosis not present

## 2017-08-31 DIAGNOSIS — Z8041 Family history of malignant neoplasm of ovary: Secondary | ICD-10-CM | POA: Diagnosis not present

## 2017-08-31 HISTORY — PX: ULNAR COLLATERAL LIGAMENT REPAIR: SHX6159

## 2017-08-31 SURGERY — REPAIR, LIGAMENT, ULNAR COLLATERAL
Anesthesia: General | Site: Thumb | Laterality: Left | Wound class: "Clean "

## 2017-08-31 MED ORDER — FENTANYL CITRATE (PF) 100 MCG/2ML IJ SOLN
25.0000 ug | INTRAMUSCULAR | Status: DC | PRN
  Administered 2017-08-31 (×2): 25 ug via INTRAVENOUS

## 2017-08-31 MED ORDER — PROPOFOL 10 MG/ML IV BOLUS
INTRAVENOUS | Status: AC
  Filled 2017-08-31: qty 20

## 2017-08-31 MED ORDER — FENTANYL CITRATE (PF) 100 MCG/2ML IJ SOLN
INTRAMUSCULAR | Status: DC | PRN
  Administered 2017-08-31 (×2): 50 ug via INTRAVENOUS
  Administered 2017-08-31: 25 ug via INTRAVENOUS
  Administered 2017-08-31: 50 ug via INTRAVENOUS
  Administered 2017-08-31: 25 ug via INTRAVENOUS

## 2017-08-31 MED ORDER — ONDANSETRON HCL 4 MG/2ML IJ SOLN
INTRAMUSCULAR | Status: DC | PRN
  Administered 2017-08-31: 4 mg via INTRAVENOUS

## 2017-08-31 MED ORDER — PROPOFOL 10 MG/ML IV BOLUS
INTRAVENOUS | Status: DC | PRN
  Administered 2017-08-31: 200 mg via INTRAVENOUS

## 2017-08-31 MED ORDER — ONDANSETRON HCL 4 MG/2ML IJ SOLN: 4.0000 mg | Freq: Once | INTRAMUSCULAR | Status: DC | PRN

## 2017-08-31 MED ORDER — CEFAZOLIN SODIUM-DEXTROSE 2-4 GM/100ML-% IV SOLN
INTRAVENOUS | Status: AC
  Filled 2017-08-31: qty 100

## 2017-08-31 MED ORDER — FENTANYL CITRATE (PF) 100 MCG/2ML IJ SOLN
INTRAMUSCULAR | Status: AC
  Administered 2017-08-31: 25 ug via INTRAVENOUS
  Filled 2017-08-31: qty 2

## 2017-08-31 MED ORDER — NEOMYCIN-POLYMYXIN B GU 40-200000 IR SOLN
Status: DC | PRN
  Administered 2017-08-31: 2 mL

## 2017-08-31 MED ORDER — DEXAMETHASONE SODIUM PHOSPHATE 10 MG/ML IJ SOLN
INTRAMUSCULAR | Status: DC | PRN
  Administered 2017-08-31: 5 mg via INTRAVENOUS

## 2017-08-31 MED ORDER — CEFAZOLIN SODIUM-DEXTROSE 2-4 GM/100ML-% IV SOLN
2.0000 g | Freq: Once | INTRAVENOUS | Status: AC
  Administered 2017-08-31: 2 g via INTRAVENOUS

## 2017-08-31 MED ORDER — HYDROCODONE-ACETAMINOPHEN 5-325 MG PO TABS: 1.0000 | ORAL_TABLET | Freq: Four times a day (QID) | ORAL | 0 refills | Status: DC | PRN

## 2017-08-31 MED ORDER — FENTANYL CITRATE (PF) 100 MCG/2ML IJ SOLN
INTRAMUSCULAR | Status: AC
  Filled 2017-08-31: qty 2

## 2017-08-31 MED ORDER — MIDAZOLAM HCL 2 MG/2ML IJ SOLN
INTRAMUSCULAR | Status: DC | PRN
  Administered 2017-08-31: 2 mg via INTRAVENOUS

## 2017-08-31 MED ORDER — LIDOCAINE HCL (CARDIAC) PF 100 MG/5ML IV SOSY
PREFILLED_SYRINGE | INTRAVENOUS | Status: DC | PRN
  Administered 2017-08-31: 100 mg via INTRAVENOUS

## 2017-08-31 MED ORDER — BUPIVACAINE HCL (PF) 0.5 % IJ SOLN
INTRAMUSCULAR | Status: AC
  Filled 2017-08-31: qty 30

## 2017-08-31 MED ORDER — MIDAZOLAM HCL 2 MG/2ML IJ SOLN
INTRAMUSCULAR | Status: AC
  Filled 2017-08-31: qty 2

## 2017-08-31 MED ORDER — BUPIVACAINE HCL (PF) 0.5 % IJ SOLN
INTRAMUSCULAR | Status: DC | PRN
  Administered 2017-08-31: 5 mL

## 2017-08-31 MED ORDER — LACTATED RINGERS IV SOLN
INTRAVENOUS | Status: DC
  Administered 2017-08-31: 12:00:00 via INTRAVENOUS

## 2017-08-31 MED ORDER — NEOMYCIN-POLYMYXIN B GU 40-200000 IR SOLN
Status: AC
  Filled 2017-08-31: qty 2

## 2017-08-31 MED ORDER — FAMOTIDINE 20 MG PO TABS
ORAL_TABLET | ORAL | Status: AC
  Filled 2017-08-31: qty 1

## 2017-08-31 SURGICAL SUPPLY — 42 items
ANCHOR SUTURETAK 2.4X8.5 MINI (Anchor) ×2 IMPLANT
BLADE OSC/SAGITTAL 5.5X25 (BLADE) ×1 IMPLANT
BNDG COHESIVE 4X5 TAN STRL (GAUZE/BANDAGES/DRESSINGS) ×1 IMPLANT
BNDG CONFORM 3 STRL LF (GAUZE/BANDAGES/DRESSINGS) ×1 IMPLANT
BNDG ESMARK 4X12 TAN STRL LF (GAUZE/BANDAGES/DRESSINGS) ×2 IMPLANT
BUR 4X55 1 (BURR) ×1 IMPLANT
BUR SURG RND 3.0X8 FLTD (BURR) ×1 IMPLANT
CHLORAPREP W/TINT 26ML (MISCELLANEOUS) ×2 IMPLANT
CUFF TOURN 18 STER (MISCELLANEOUS) ×1 IMPLANT
DRAPE FLUOR MINI C-ARM 54X84 (DRAPES) ×1 IMPLANT
FORCEPS JEWEL BIP 4-3/4 STR (INSTRUMENTS) ×2 IMPLANT
GAUZE PETRO XEROFOAM 1X8 (MISCELLANEOUS) ×2 IMPLANT
GAUZE SPONGE 4X4 12PLY STRL (GAUZE/BANDAGES/DRESSINGS) ×2 IMPLANT
GAUZE XEROFORM 4X4 STRL (GAUZE/BANDAGES/DRESSINGS) ×1 IMPLANT
GLOVE BIO SURGEON STRL SZ8 (GLOVE) ×4 IMPLANT
GLOVE INDICATOR 8.0 STRL GRN (GLOVE) ×2 IMPLANT
GOWN STRL REUS W/ TWL LRG LVL3 (GOWN DISPOSABLE) ×1 IMPLANT
GOWN STRL REUS W/ TWL XL LVL3 (GOWN DISPOSABLE) ×1 IMPLANT
GOWN STRL REUS W/TWL LRG LVL3 (GOWN DISPOSABLE) ×1
GOWN STRL REUS W/TWL XL LVL3 (GOWN DISPOSABLE) ×1
KIT TURNOVER KIT A (KITS) ×2 IMPLANT
NDL HYPO 25X1 1.5 SAFETY (NEEDLE) ×1 IMPLANT
NEEDLE HYPO 25X1 1.5 SAFETY (NEEDLE) ×2 IMPLANT
NS IRRIG 500ML POUR BTL (IV SOLUTION) ×2 IMPLANT
PACK EXTREMITY ARMC (MISCELLANEOUS) ×2 IMPLANT
PASSER SUT SWANSON 36MM LOOP (INSTRUMENTS) ×1 IMPLANT
SPLINT WRIST M LT TX990308 (SOFTGOODS) ×2 IMPLANT
STOCKINETTE 48X4 2 PLY STRL (GAUZE/BANDAGES/DRESSINGS) ×1 IMPLANT
STOCKINETTE IMPERVIOUS 9X36 MD (GAUZE/BANDAGES/DRESSINGS) ×1 IMPLANT
STOCKINETTE STRL 4IN 9604848 (GAUZE/BANDAGES/DRESSINGS) ×2 IMPLANT
SUT ETHIBOND 3-0  EXTR (SUTURE)
SUT ETHIBOND 3-0 EXTR (SUTURE) ×1 IMPLANT
SUT MERSILENE 4-0 WHT RB-1 (SUTURE) ×1 IMPLANT
SUT PROLENE 1 CT (SUTURE) ×1 IMPLANT
SUT PROLENE 4 0 PS 2 18 (SUTURE) ×1 IMPLANT
SUT VIC AB 0 CT2 27 (SUTURE) ×1 IMPLANT
SUT VIC AB 2-0 SH 27 (SUTURE) ×1
SUT VIC AB 2-0 SH 27XBRD (SUTURE) ×1 IMPLANT
SUT VICRYL 5-0 (SUTURE) ×1 IMPLANT
SUT VICRYL+ 3-0 27IN RB-1 (SUTURE) ×1 IMPLANT
SYR 10ML LL (SYRINGE) ×2 IMPLANT
WIRE Z .062 C-WIRE SPADE TIP (WIRE) ×1 IMPLANT

## 2017-08-31 NOTE — Discharge Instructions (Addendum)
Orthopedic discharge instructions: Keep splint dry and intact. Keep hand elevated above heart level. Apply ice to affected area frequently. Take ibuprofen 600-800 mg TID with meals for 7-10 days, then as necessary. Take pain medication as prescribed or ES Tylenol when needed.  Return for follow-up in 10-14 days or as scheduled.  AMBULATORY SURGERY  DISCHARGE INSTRUCTIONS   1) The drugs that you were given will stay in your system until tomorrow so for the next 24 hours you should not:  A) Drive an automobile B) Make any legal decisions C) Drink any alcoholic beverage   2) You may resume regular meals tomorrow.  Today it is better to start with liquids and gradually work up to solid foods.  You may eat anything you prefer, but it is better to start with liquids, then soup and crackers, and gradually work up to solid foods.   3) Please notify your doctor immediately if you have any unusual bleeding, trouble breathing, redness and pain at the surgery site, drainage, fever, or pain not relieved by medication.    4) Additional Instructions:        Please contact your physician with any problems or Same Day Surgery at [phone], Monday through Friday 6 am to 4 pm, or [place] [facility] [place] [facility] number at [phone]. 

## 2017-08-31 NOTE — H&P (Signed)
Paper H&P to be scanned into permanent record. H&P reviewed and patient re-examined. No changes. 

## 2017-08-31 NOTE — Op Note (Addendum)
08/31/2017  2:05 PM  Patient:   Tracy Booth  Pre-Op Diagnosis:   Displaced bony ulnar collateral ligament injury, left thumb.  Post-Op Diagnosis:   Same  Procedure:   Repair of ulnar collateral ligament injury, left thumb.  Surgeon:   Pascal Lux, MD  Assistant:   Cameron Proud, PA-C  Anesthesia:   General LMA  Findings:   As above.  Complications:   None  Fluids:   400 cc crystalloid  EBL:   0 cc  UOP:   None  TT:   41 minutes at 250 mmHg  Drains:   None  Closure:   4-0 Prolene interrupted sutures  Implants:   Arthrex 2.4 mm mini BioComposite SutureTak x2  Brief Clinical Note:   The patient is a 47 year old female who sustained the above-noted injury 12 days ago when she apparently fell onto her outstretched left hand while celebrating after completing a road race.  She presented to [facility] urgent care facility where x-rays demonstrated the above-noted injury.  She presents at this time for definitive management of her injury.  Procedure:   The patient was brought into the operating room and lain in the supine position.  After adequate general laryngeal mask anesthesia was obtained, the patient's left hand and upper extremity were prepped with ChloraPrep solution before being draped sterilely.  Preoperative antibiotics were administered.  A timeout was performed to verify the appropriate surgical site before the limb was exsanguinated with an Esmarch and the tourniquet inflated to 250 mmHg.  A curvilinear incision was made over the ulnar aspect of the thumb MCP joint.  The incision was carried down through the subcutaneous tissues with care taken to identify protect any dorsal sensory nerve branches.  The bony lesion was identified and dissected free from the surrounding tissues.  It clearly was superficial to the adductor aponeurosis.  The abductor aponeurosis was released just ulnar to the extensor pollicis longus tendon from proximal to distal sufficiently  to expose the base of the proximal phalanx from which the bony fragment had avulsed.  The bony bed was lightly abraded of fibrinous tissue using a tiny curette and rongeurs.  Two 2.4 mm Arthrex BioComposite SutureTak anchors were inserted and the sutures passed through the tissue of the ulnar collateral ligament adjacent to the bone fragment.  The sutures were oriented so as to allow the bone fragment to rotate into the exposed avulsion site on the proximal phalanx.  A near anatomic reduction appeared to have been achieved.  The wound was copiously irrigated with sterile saline solution before the abductor aponeurosis was reapproximated to itself using 2-0 Vicryl interrupted sutures.  The skin was closed using 4-0 Prolene interrupted sutures before a total of 5 cc of 0.5% plain Sensorcaine was injected in around the incision to help with postoperative analgesia.  A sterile bulky dressing was applied to the thumb before the thumb was placed into a thumb spica splint maintaining the thumb in a relaxed functional position.  The patient was then awakened, extubated, and returned to the recovery room in satisfactory condition after tolerating the procedure well.

## 2017-08-31 NOTE — Transfer of Care (Signed)
Immediate Anesthesia Transfer of Care Note  Patient: Tracy Booth  Procedure(s) Performed: ULNAR COLLATERAL LIGAMENT REPAIR- LEFT THUMB (Left Thumb)  Patient Location: PACU  Anesthesia Type:General  Level of Consciousness: awake  Airway & Oxygen Therapy: Patient Spontanous Breathing and Patient connected to face mask oxygen  Post-op Assessment: Report given to RN and Post -op Vital signs reviewed and stable  Post vital signs: Reviewed  Last Vitals:  Vitals Value Taken Time  BP 142/90 08/31/2017  1:54 PM  Temp 36.1 C 08/31/2017  1:54 PM  Pulse 71 08/31/2017  1:54 PM  Resp 22 08/31/2017  1:54 PM  SpO2 100 % 08/31/2017  1:54 PM  Vitals shown include unvalidated device data.  Last Pain:  Vitals:   08/31/17 1138  TempSrc: Oral  PainSc: 0-No pain         Complications: No apparent anesthesia complications

## 2017-08-31 NOTE — Anesthesia Post-op Follow-up Note (Signed)
Anesthesia QCDR form completed.        

## 2017-08-31 NOTE — Anesthesia Procedure Notes (Signed)
Procedure Name: LMA Insertion Date/Time: 08/31/2017 12:41 PM Performed by: Rolla Plate, CRNA Pre-anesthesia Checklist: Patient identified, Emergency Drugs available, Suction available, Patient being monitored and Timeout performed Patient Re-evaluated:Patient Re-evaluated prior to induction Oxygen Delivery Method: Circle system utilized Preoxygenation: Pre-oxygenation with 100% oxygen Induction Type: IV induction Ventilation: Mask ventilation without difficulty LMA: LMA inserted LMA Size: 4.0 Number of attempts: 1 Dental Injury: Teeth and Oropharynx as per pre-operative assessment

## 2017-08-31 NOTE — Anesthesia Preprocedure Evaluation (Signed)
Anesthesia Evaluation  Patient identified by MRN, date of birth, ID band Patient awake    Reviewed: Allergy & Precautions, H&P , NPO status , Patient's Chart, lab work & pertinent test results, reviewed documented beta blocker date and time   History of Anesthesia Complications (+) PONV and history of anesthetic complications  Airway Mallampati: III  TM Distance: >3 FB Neck ROM: full    Dental  (+) Teeth Intact   Pulmonary neg pulmonary ROS,  Hx of spontaneous pneumothorax x 2.  No present pulmonary sx.     Pulmonary exam normal        Cardiovascular Exercise Tolerance: Good negative cardio ROS Normal cardiovascular exam+ Valvular Problems/Murmurs  Rate:Normal     Neuro/Psych negative neurological ROS  negative psych ROS   GI/Hepatic negative GI ROS, Neg liver ROS, GERD  ,  Endo/Other  negative endocrine ROS  Renal/GU negative Renal ROS  negative genitourinary   Musculoskeletal   Abdominal   Peds  Hematology negative hematology ROS (+)   Anesthesia Other Findings   Reproductive/Obstetrics negative OB ROS                             Anesthesia Physical Anesthesia Plan  ASA: II  Anesthesia Plan: General LMA   Post-op Pain Management:    Induction:   PONV Risk Score and Plan: 4 or greater  Airway Management Planned:   Additional Equipment:   Intra-op Plan:   Post-operative Plan:   Informed Consent: I have reviewed the patients History and Physical, chart, labs and discussed the procedure including the risks, benefits and alternatives for the proposed anesthesia with the patient or authorized representative who has indicated his/her understanding and acceptance.     Plan Discussed with: CRNA  Anesthesia Plan Comments:         Anesthesia Quick Evaluation

## 2017-08-31 NOTE — Op Note (Addendum)
08/31/2017  1:52 PM  Patient:   Tracy Booth  Pre-Op Diagnosis:   Displaced bony ulnar collateral ligament injury, left thumb.  Post-Op Diagnosis:   Same  Procedure:   Repair of ulnar collateral ligament injury, left thumb.  Surgeon:   Pascal Lux, MD  Assistant:   Cameron Proud, PA-C  Anesthesia:   General LMA  Findings:   As above.  Complications:   None  Fluids:   400 cc crystalloid  EBL:   0 cc  UOP:   None  TT:   41 minutes at 250 mmHg  Drains:   None  Closure:   4-0 Prolene interrupted sutures  Implants:   Arthrex 2.4 mm mini BioComposite SutureTak x2  Brief Clinical Note:   The patient is a 47 year old female who sustained the above-noted injury 12 days ago when she apparently fell onto her outstretched left hand while celebrating after completing a road race.  She presented to [facility] urgent care facility where x-rays demonstrated the above-noted injury.  She presents at this time for definitive management of her injury.  Procedure:   The patient was brought into the operating room and lain in the supine position.  After adequate general laryngeal mask anesthesia was obtained, the patient's left hand and upper extremity were prepped with ChloraPrep solution before being draped sterilely.  Preoperative antibiotics were administered.  A timeout was performed to verify the appropriate surgical site before the limb was exsanguinated with an Esmarch and the tourniquet inflated to 250 mmHg.  A curvilinear incision was made over the ulnar aspect of the thumb MCP joint.  The incision was carried down through the subcutaneous tissues with care taken to identify protect any dorsal sensory nerve branches.  The bony lesion was identified and dissected free from the surrounding tissues.  It clearly was superficial to the adductor aponeurosis.  The abductor aponeurosis was released just ulnar to the extensor pollicis longus tendon from proximal to distal sufficiently  to expose the base of the proximal phalanx from which the bony fragment had avulsed.  The bony bed was lightly abraded of fibrinous tissue using a tiny curette and rongeurs.  Two 2.4 mm Arthrex BioComposite SutureTak anchors were inserted and the sutures passed through the tissue of the ulnar collateral ligament adjacent to the bone fragment.  The sutures were oriented so as to allow the bone fragment to rotate into the exposed avulsion site on the proximal phalanx.  A near anatomic reduction appeared to have been achieved.  The wound was copiously irrigated with sterile saline solution before the abductor aponeurosis was reapproximated to itself using 2-0 Vicryl interrupted sutures.  The skin was closed using 4-0 Prolene interrupted sutures before a total of 5 cc of 0.5% plain Sensorcaine was injected in around the incision to help with postoperative analgesia.  A sterile bulky dressing was applied to the thumb before the thumb was placed into a thumb spica splint maintaining the thumb in a relaxed functional position.  The patient was then awakened, extubated, and returned to the recovery room in satisfactory condition after tolerating the procedure well.

## 2017-09-04 NOTE — Anesthesia Postprocedure Evaluation (Signed)
Anesthesia Post Note  Patient: Lincy F Boulais  Procedure(s) Performed: ULNAR COLLATERAL LIGAMENT REPAIR- LEFT THUMB (Left Thumb)  Patient location during evaluation: PACU Anesthesia Type: General Level of consciousness: awake and alert and oriented Pain management: pain level controlled Vital Signs Assessment: post-procedure vital signs reviewed and stable Respiratory status: spontaneous breathing Cardiovascular status: blood pressure returned to baseline Anesthetic complications: no     Last Vitals:  Vitals:   08/31/17 1455 08/31/17 1505  BP: (!) 144/79 (!) 145/84  Pulse: 72 70  Resp: 16 16  Temp: (!) 36.4 C   SpO2: 98% 98%    Last Pain:  Vitals:   09/01/17 0906  TempSrc:   PainSc: 1                  Rainen Vanrossum

## 2017-09-13 DIAGNOSIS — S63642D Sprain of metacarpophalangeal joint of left thumb, subsequent encounter: Secondary | ICD-10-CM | POA: Diagnosis not present

## 2017-10-31 ENCOUNTER — Encounter: Payer: Self-pay | Admitting: Occupational Therapy

## 2017-10-31 ENCOUNTER — Ambulatory Visit: Payer: BLUE CROSS/BLUE SHIELD | Attending: Surgery | Admitting: Occupational Therapy

## 2017-10-31 ENCOUNTER — Other Ambulatory Visit: Payer: Self-pay

## 2017-10-31 DIAGNOSIS — L905 Scar conditions and fibrosis of skin: Secondary | ICD-10-CM | POA: Diagnosis not present

## 2017-10-31 DIAGNOSIS — M25642 Stiffness of left hand, not elsewhere classified: Secondary | ICD-10-CM

## 2017-10-31 DIAGNOSIS — M6281 Muscle weakness (generalized): Secondary | ICD-10-CM | POA: Insufficient documentation

## 2017-10-31 NOTE — Therapy (Signed)
[facility] [address], [place], [address] Phone: [phone]   Fax:  [phone]  Occupational Therapy Evaluation  Patient Details  Name: Tracy Booth MRN: [id number] Date of Birth: 1970/04/28 Referring Provider: Roland Rack   Encounter Date: 10/31/2017  OT End of Session - 10/31/17 1534    Visit Number  1    Number of Visits  8    Date for OT Re-Evaluation  11/28/17    OT Start Time  1330    OT Stop Time  1418    OT Time Calculation (min)  48 min    Activity Tolerance  Patient tolerated treatment well    Behavior During Therapy  [facility] for tasks assessed/performed       Past Medical History:  Diagnosis Date  . Complication of anesthesia   . Dizziness    occas  . Heart murmur   . History of infertility   . Obesity   . PONV (postoperative nausea and vomiting)   . Seasonal allergies   . Spontaneous pneumothorax     Past Surgical History:  Procedure Laterality Date  . ABLATION ON ENDOMETRIOSIS  2012  . LUNG SURGERY Left 1997 and 1999  . TONSILLECTOMY  1975  . ULNAR COLLATERAL LIGAMENT REPAIR Left 08/31/2017   Procedure: ULNAR COLLATERAL LIGAMENT REPAIR- LEFT THUMB;  Surgeon: Corky Mull, MD;  Location: [facility] ORS;  Service: Orthopedics;  Laterality: Left;    There were no vitals filed for this visit.  Subjective Assessment - 10/31/17 1511    Subjective   I fell and caught myself on my thumb - had surgery on 08/31/17 - was in cast and then splint - feels good in splint but it is very stiff -pain is good - no really pain - but then I am not using it     Patient Stated Goals  Want to get the flexibility in my thumb back and strength to cut things, open containers , hold toothbrush, fasten bra, turn doorknob , switches     Currently in Pain?  No/denies        [facility] OT Assessment - 10/31/17 0001      Assessment   Medical Diagnosis  L thumb repair of ulnar collateral ligament     Referring  Provider  Poggi    Onset Date/Surgical Date  08/31/17    Hand Dominance  Right    Next MD Visit  -- 16th Aug      Precautions   Required Braces or Orthoses  -- Thumb spica prefab      Home  Environment   Lives With  Family      Prior Function   Vocation  Full time employment    Leisure  work on Secondary school teacher, likes to read , cook , house work ,  take care of dog -has 40 yr old son       Strength   Right Hand Lateral Pinch  18 lbs    Right Hand 3 Point Pinch  16 lbs    Left Hand Lateral Pinch  10 lbs    Left Hand 3 Point Pinch  7 lbs      Right Hand AROM   R Thumb MCP 0-60  64 Degrees    R Thumb IP 0-80  90 Degrees    R Thumb Radial ABduction/ADduction 0-55  52    R Thumb Palmar ABduction/ADduction 0-45  65    R Thumb Opposition  to Index  -- Opposition to [facility]      Left Hand AROM   L Thumb MCP 0-60  35 Degrees    L Thumb IP 0-80  35 Degrees    L Thumb Radial ADduction/ABduction 0-55  48    L Thumb Palmar ADduction/ABduction 0-45  50         fluidotherapy done - AROM for L  thumb in all planes prior to review of HEP - pain decrease and stiffness  Hand out review     Heat Scar massage and cica scar pad at night time  Cont with thumb spica    PROM for thumb IP, MP and composite  Blocked AROM for IP , MP of thumb  Composite flexion to to all digits and sliding down  Opposition  PA and RA of thumb AROM and gentle stretch for thumb RA  10 reps all  Circles with thumb - both directions             OT Education - 10/31/17 1534    Education Details  findings of eval and HEP     Person(s) Educated  Patient    Methods  Explanation;Demonstration;Verbal cues;Handout    Comprehension  Verbalized understanding       OT Short Term Goals - 10/31/17 1538      OT SHORT TERM GOAL #1   Title  Pt to be ind in HEP to decrease scar tissue, increase ROM in thumb , and wean out of thumb spica     Time  3    Period  Weeks    Status  New    Target Date   11/21/17      OT SHORT TERM GOAL #2   Title  L thumb AROM improve to pt to touch 2nd of of thumb to 1-2 cm objects out of palm     Baseline  see flowsheet     Time  3    Period  Weeks    Status  New    Target Date  11/21/17      OT SHORT TERM GOAL #3   Title  L thumb PA and RA improve for pt to hold 1/2 full glass , open doorknob, and wide grip objects less than 1 lbs    Baseline   see flowsheet     Time  3    Period  Weeks    Status  New    Target Date  11/21/17        OT Long Term Goals - 10/31/17 1541      OT LONG TERM GOAL #1   Title  Prehension and grip strength in L hand improve to more than 75% compare to R hand to cut food, fasten bra, do switch pull up pans , nail clip    Baseline  Decrease see flowsheet     Time  4    Period  Weeks    Status  New    Target Date  11/28/17      OT LONG TERM GOAL #2   Title  Function score on PRHWE improve with more than 20 points     Baseline  functions score on PRWHE at eval 22/50     Time  4    Period  Weeks    Status  New    Target Date  11/28/17            Plan - 10/31/17 1535    Clinical Impression Statement  Pt present at OT eval about 9 wks s/p L thumb ulnar collateral ligament repair - pt still in thumb spica -off with ADL's and when sitting - pt show increase scar adhesion on ulnar side of thumb in webspace  , limitiing her AROM and PROM of L thumb , decrease strength - limiting her functional use of L hand in ADL's and IADlL's     Occupational performance deficits (Please refer to evaluation for details):  ADL's;IADL's;Work;Play;Leisure    Rehab Potential  Good    OT Frequency  2x / week    OT Duration  4 weeks    OT Treatment/Interventions  Self-care/ADL training;Therapeutic exercise;Patient/family education;Splinting;Paraffin;Manual Therapy;Passive range of motion;Scar mobilization;Fluidtherapy    Plan  assess progress wiht HEP and update as needed     Clinical Decision Making  Several treatment options,  min-mod task modification necessary    OT Home Exercise Plan  see pt instruction    Consulted and Agree with Plan of Care  Patient       Patient will benefit from skilled therapeutic intervention in order to improve the following deficits and impairments:  Impaired flexibility, Decreased scar mobility, Impaired UE functional use, Decreased strength, Decreased range of motion, Decreased coordination  Visit Diagnosis: Stiffness of left hand, not elsewhere classified - Plan: Ot plan of care cert/re-cert  Scar condition and fibrosis of skin - Plan: Ot plan of care cert/re-cert  Muscle weakness (generalized) - Plan: Ot plan of care cert/re-cert    Problem List Patient Active Problem List   Diagnosis Date Noted  . Vertigo 11/25/2013  . Esophageal reflux 11/25/2013  . Metrorrhagia 08/18/2010    Rosalyn Gess OTR/L,CLT 10/31/2017, 3:45 PM  [facility] [address], [place], [address] Phone: [phone]   Fax:  [phone]  Name: Tracy Booth MRN: [id number] Date of Birth: 1970/05/31

## 2017-10-31 NOTE — Patient Instructions (Signed)
Heat Scar massage and cica scar pad at night time  Cont with thumb spica    PROM for thumb IP, MP and composite  Blocked AROM for IP , MP of thumb  Composite flexion to to all digits and sliding down  Opposition  PA and RA of thumb AROM and gentle stretch for thumb RA  10 reps all  Circles with thumb - both directions

## 2017-11-02 ENCOUNTER — Ambulatory Visit: Payer: BLUE CROSS/BLUE SHIELD | Admitting: Occupational Therapy

## 2017-11-02 DIAGNOSIS — L905 Scar conditions and fibrosis of skin: Secondary | ICD-10-CM

## 2017-11-02 DIAGNOSIS — M6281 Muscle weakness (generalized): Secondary | ICD-10-CM

## 2017-11-02 DIAGNOSIS — M25642 Stiffness of left hand, not elsewhere classified: Secondary | ICD-10-CM

## 2017-11-02 NOTE — Patient Instructions (Signed)
Same HEP as last time  But husband to assist with webspace spread soft tissue massage  Isometric strengthening in mid position to thumb - in all planes  2 x 10 reps  wean out of splint during day - 2 hrs on and off

## 2017-11-02 NOTE — Therapy (Signed)
[facility] [address], [place], [address] Phone: [phone]   Fax:  [phone]  Occupational Therapy Treatment  Patient Details  Name: Tracy Booth MRN: [id number] Date of Birth: 1970/11/14 Referring Provider: Roland Rack   Encounter Date: 11/02/2017  OT End of Session - 11/02/17 1300    Visit Number  2    Number of Visits  8    Date for OT Re-Evaluation  11/28/17    OT Start Time  1215    OT Stop Time  1250    OT Time Calculation (min)  35 min    Activity Tolerance  Patient tolerated treatment well    Behavior During Therapy  [facility] for tasks assessed/performed       Past Medical History:  Diagnosis Date  . Complication of anesthesia   . Dizziness    occas  . Heart murmur   . History of infertility   . Obesity   . PONV (postoperative nausea and vomiting)   . Seasonal allergies   . Spontaneous pneumothorax     Past Surgical History:  Procedure Laterality Date  . ABLATION ON ENDOMETRIOSIS  2012  . LUNG SURGERY Left 1997 and 1999  . TONSILLECTOMY  1975  . ULNAR COLLATERAL LIGAMENT REPAIR Left 08/31/2017   Procedure: ULNAR COLLATERAL LIGAMENT REPAIR- LEFT THUMB;  Surgeon: Corky Mull, MD;  Location: [facility] ORS;  Service: Orthopedics;  Laterality: Left;    There were no vitals filed for this visit.  Subjective Assessment - 11/02/17 1223    Subjective   I feels good to use my thumb and exercise it -and the scar moving better- little sore from working it out but nothing bac   (Pended)     Patient Stated Goals  Want to get the flexibility in my thumb back and strength to cut things, open containers , hold toothbrush, fasten bra, turn doorknob , switches   (Pended)     Currently in Pain?  Yes  (Pended)          [facility] OT Assessment - 11/02/17 0001      Left Hand AROM   L Thumb MCP 0-60  40 Degrees    L Thumb IP 0-80  42 Degrees    L Thumb Radial ADduction/ABduction 0-55  52    L Thumb Palmar  ADduction/ABduction 0-45  54      assess progress in AROM of thumb  Wrist still tight - flexion worse than extnetion          OT Treatments/Exercises (OP) - 11/02/17 0001      LUE Fluidotherapy   Number Minutes Fluidotherapy  10 Minutes    LUE Fluidotherapy Location  Hand    Comments  AROM for thumb and wrist in all planres at [facility]       cone Graston tool nr 2 for sweeping to dorsal and volar wrist- prior to wrist AROM and AAROM  Gentle traction to wrist prior to flexion AAROM   with good success   Scar massage  PROM for thumb IP, MP and composite  Flexion  Blocked AROM for IP , MP of thumb  Flexion  Composite flexion to to all digits and sliding down  -side of 4th and 5th  Opposition  PA and RA of thumb AROM and gentle stretch for thumb RA / and PA 10 reps all  Circles with thumb - both directions    But husband to assist with webspace spread  soft tissue massage  Isometric strengthening in mid position to thumb - in all planes  2 x 10 reps  wean out of splint during day - 2 hrs on and off          OT Education - 11/02/17 1259    Education Details  add isometric strengthening to HEP     Person(s) Educated  Patient    Methods  Explanation;Demonstration    Comprehension  Verbalized understanding;Returned demonstration       OT Short Term Goals - 10/31/17 1538      OT SHORT TERM GOAL #1   Title  Pt to be ind in HEP to decrease scar tissue, increase ROM in thumb , and wean out of thumb spica     Time  3    Period  Weeks    Status  New    Target Date  11/21/17      OT SHORT TERM GOAL #2   Title  L thumb AROM improve to pt to touch 2nd of of thumb to 1-2 cm objects out of palm     Baseline  see flowsheet     Time  3    Period  Weeks    Status  New    Target Date  11/21/17      OT SHORT TERM GOAL #3   Title  L thumb PA and RA improve for pt to hold 1/2 full glass , open doorknob, and wide grip objects less than 1 lbs    Baseline   see flowsheet      Time  3    Period  Weeks    Status  New    Target Date  11/21/17        OT Long Term Goals - 10/31/17 1541      OT LONG TERM GOAL #1   Title  Prehension and grip strength in L hand improve to more than 75% compare to R hand to cut food, fasten bra, do switch pull up pans , nail clip    Baseline  Decrease see flowsheet     Time  4    Period  Weeks    Status  New    Target Date  11/28/17      OT LONG TERM GOAL #2   Title  Function score on PRHWE improve with more than 20 points     Baseline  functions score on PRWHE at eval 22/50     Time  4    Period  Weeks    Status  New    Target Date  11/28/17            Plan - 11/02/17 1300    Clinical Impression Statement  Pt show increase AROM in L thumb since eval earlier this week - no pain only some soreness from exercises  - pt to start wean out of splint during day- 2 hrs on and off - and add this date isometric to thumb     Occupational performance deficits (Please refer to evaluation for details):  ADL's;IADL's;Work;Play;Leisure    Rehab Potential  Good    OT Frequency  2x / week    OT Duration  4 weeks    OT Treatment/Interventions  Self-care/ADL training;Therapeutic exercise;Patient/family education;Splinting;Paraffin;Manual Therapy;Passive range of motion;Scar mobilization;Fluidtherapy    Plan  assess progress wiht HEP and update as needed     Clinical Decision Making  Several treatment options, min-mod task modification necessary    OT  Home Exercise Plan  see pt instruction    Consulted and Agree with Plan of Care  Patient       Patient will benefit from skilled therapeutic intervention in order to improve the following deficits and impairments:  Impaired flexibility, Decreased scar mobility, Impaired UE functional use, Decreased strength, Decreased range of motion, Decreased coordination  Visit Diagnosis: Stiffness of left hand, not elsewhere classified  Scar condition and fibrosis of skin  Muscle weakness  (generalized)    Problem List Patient Active Problem List   Diagnosis Date Noted  . Vertigo 11/25/2013  . Esophageal reflux 11/25/2013  . Metrorrhagia 08/18/2010    Rosalyn Gess OTR/L,CLT 11/02/2017, 1:02 PM  [facility] [address], [place], [address] Phone: [phone]   Fax:  [phone]  Name: Tracy Booth MRN: [id number] Date of Birth: 01-Nov-1970

## 2017-11-06 ENCOUNTER — Ambulatory Visit: Payer: BLUE CROSS/BLUE SHIELD | Admitting: Occupational Therapy

## 2017-11-07 ENCOUNTER — Ambulatory Visit: Payer: BLUE CROSS/BLUE SHIELD | Admitting: Occupational Therapy

## 2017-11-07 DIAGNOSIS — L905 Scar conditions and fibrosis of skin: Secondary | ICD-10-CM

## 2017-11-07 DIAGNOSIS — M25642 Stiffness of left hand, not elsewhere classified: Secondary | ICD-10-CM

## 2017-11-07 DIAGNOSIS — M6281 Muscle weakness (generalized): Secondary | ICD-10-CM

## 2017-11-07 NOTE — Therapy (Signed)
[facility] [address], [place], [address] Phone: [phone]   Fax:  [phone]  Occupational Therapy Treatment  Patient Details  Name: Tracy Booth MRN: [id number] Date of Birth: December 07, 1970 Referring Provider: Roland Rack   Encounter Date: 11/07/2017  OT End of Session - 11/07/17 1216    Visit Number  3    Number of Visits  8    Date for OT Re-Evaluation  11/28/17    OT Start Time  1203    OT Stop Time  1241    OT Time Calculation (min)  38 min    Activity Tolerance  Patient tolerated treatment well    Behavior During Therapy  [facility] for tasks assessed/performed       Past Medical History:  Diagnosis Date  . Complication of anesthesia   . Dizziness    occas  . Heart murmur   . History of infertility   . Obesity   . PONV (postoperative nausea and vomiting)   . Seasonal allergies   . Spontaneous pneumothorax     Past Surgical History:  Procedure Laterality Date  . ABLATION ON ENDOMETRIOSIS  2012  . LUNG SURGERY Left 1997 and 1999  . TONSILLECTOMY  1975  . ULNAR COLLATERAL LIGAMENT REPAIR Left 08/31/2017   Procedure: ULNAR COLLATERAL LIGAMENT REPAIR- LEFT THUMB;  Surgeon: Corky Mull, MD;  Location: [facility] OR[facility];  Service: Orthopedics;  Laterality: Left;    There were no vitals filed for this visit.  Subjective Assessment - 11/07/17 1213    Subjective   Doing okay - leaving splint off about 3 hrs at time - sleeping with it still - using my thumb more - and wrist  I can tell is getting stronger     Patient Stated Goals  Want to get the flexibility in my thumb back and strength to cut things, open containers , hold toothbrush, fasten bra, turn doorknob , switches     Currently in Pain?  No/denies            assess thumb opposition and thumb PA and RA  And resistance -        OT Treatments/Exercises (OP) - 11/07/17 0001      LUE Paraffin   Number Minutes Paraffin  8 Minutes    LUE  Paraffin Location  Hand;Wrist    Comments  At [facility]  - coban flexion stretch to thumb -and PROM for wrist flexion - 10 reps        done  Graston tool nr 2 for sweeping to dorsal and volar wrist- prior to wrist AROM and AAROM  Gentle traction to wrist prior to flexion AAROM   with good success  Pt to do PROM to wrist flexion - composite with hand in loose fist too and open hand  Scar massage manual with PA and RA stretch to thumb   PROM for thumb IP, MP and composite  Flexion  Blocked AROM for IP , MP of thumb  Flexion  Composite flexion to to all digits and sliding down  -side of 4th and 5th  Opposition to 2 cm foam block to base of 5th   PROM and AROM 10 reps  add  PA and RA of thumb AROM and gentle stretch for thumb RA / and PA 10 reps all  Circles with thumb - both directions   But husband to assist with webspace spread soft tissue massage  Rubber band add to PA  and RA -  2 x 10 reps  2 x day  wean out of splint during night time too        OT Education - 11/07/17 1216    Education Details  upgrade HEP     Person(s) Educated  Patient    Methods  Explanation;Demonstration;Handout    Comprehension  Verbalized understanding;Returned demonstration       OT Short Term Goals - 10/31/17 1538      OT SHORT TERM GOAL #1   Title  Pt to be ind in HEP to decrease scar tissue, increase ROM in thumb , and wean out of thumb spica     Time  3    Period  Weeks    Status  New    Target Date  11/21/17      OT SHORT TERM GOAL #2   Title  L thumb AROM improve to pt to touch 2nd of of thumb to 1-2 cm objects out of palm     Baseline  see flowsheet     Time  3    Period  Weeks    Status  New    Target Date  11/21/17      OT SHORT TERM GOAL #3   Title  L thumb PA and RA improve for pt to hold 1/2 full glass , open doorknob, and wide grip objects less than 1 lbs    Baseline   see flowsheet     Time  3    Period  Weeks    Status  New    Target Date  11/21/17         OT Long Term Goals - 10/31/17 1541      OT LONG TERM GOAL #1   Title  Prehension and grip strength in L hand improve to more than 75% compare to R hand to cut food, fasten bra, do switch pull up pans , nail clip    Baseline  Decrease see flowsheet     Time  4    Period  Weeks    Status  New    Target Date  11/28/17      OT LONG TERM GOAL #2   Title  Function score on PRHWE improve with more than 20 points     Baseline  functions score on PRWHE at eval 22/50     Time  4    Period  Weeks    Status  New    Target Date  11/28/17            Plan - 11/07/17 1330    Clinical Impression Statement  Pt cont to make progress in pain , weaning out of splint and AROM - intiate this date more strengthening for PA and RA - pt to do wrist flexion stretch with hand in loose fist - and Opposition to base of 5th -     Occupational performance deficits (Please refer to evaluation for details):  ADL's;IADL's;Work;Play;Leisure    Rehab Potential  Good    OT Frequency  2x / week    OT Duration  4 weeks    OT Treatment/Interventions  Self-care/ADL training;Therapeutic exercise;Patient/family education;Splinting;Paraffin;Manual Therapy;Passive range of motion;Scar mobilization;Fluidtherapy    Plan  assess progress  and grip /prehension - upgrade HEP as needed     Clinical Decision Making  Several treatment options, min-mod task modification necessary    OT Home Exercise Plan  see pt instruction    Consulted and Agree with  Plan of Care  Patient       Patient will benefit from skilled therapeutic intervention in order to improve the following deficits and impairments:  Impaired flexibility, Decreased scar mobility, Impaired UE functional use, Decreased strength, Decreased range of motion, Decreased coordination  Visit Diagnosis: Stiffness of left hand, not elsewhere classified  Scar condition and fibrosis of skin  Muscle weakness (generalized)    Problem List Patient Active Problem  List   Diagnosis Date Noted  . Vertigo 11/25/2013  . Esophageal reflux 11/25/2013  . Metrorrhagia 08/18/2010    Rosalyn Gess OTR/l,CLT 11/07/2017, 1:33 PM  [facility] [address] [place], [address] Phone: [phone]   Fax:  [phone]  Name: MAEGAN BULLER MRN: [id number] Date of Birth: Sep 21, 1970

## 2017-11-07 NOTE — Patient Instructions (Signed)
Same HEP - add thumb PA and RA rubber band - 2 x10 reps   2 x day  And Opposition to base of 5th - 2 cm foam block - PROM - and then AROM  Opposition to all digits and slide down 5th to 2nd fold

## 2017-11-14 ENCOUNTER — Ambulatory Visit: Payer: BLUE CROSS/BLUE SHIELD | Admitting: Occupational Therapy

## 2017-11-14 DIAGNOSIS — M25642 Stiffness of left hand, not elsewhere classified: Secondary | ICD-10-CM

## 2017-11-14 DIAGNOSIS — M6281 Muscle weakness (generalized): Secondary | ICD-10-CM | POA: Diagnosis not present

## 2017-11-14 DIAGNOSIS — L905 Scar conditions and fibrosis of skin: Secondary | ICD-10-CM

## 2017-11-14 NOTE — Patient Instructions (Signed)
Cont with thumb , digits and wrist ROM HEP  But add 16 oz hammer for wrist in all planes - 12 reps  And increase over the week to 2-3 sets if not pain   And putty teal for gripping add  12 reps  Can increase sets over the week  And to do 2 x day

## 2017-11-14 NOTE — Therapy (Signed)
[place] [facility] [address] [place], [address] Phone: [phone]   Fax:  [phone]  Occupational Therapy Treatment  Patient Details  Name: Tracy Booth MRN: [id number] Date of Birth: 09/14/1970 Referring Provider: Roland Rack   Encounter Date: 11/14/2017  OT End of Session - 11/14/17 1740    Visit Number  4    Number of Visits  8    Date for OT Re-Evaluation  11/28/17    OT Start Time  1345    OT Stop Time  1429    OT Time Calculation (min)  44 min    Activity Tolerance  Patient tolerated treatment well    Behavior During Therapy  [facility] for tasks assessed/performed       Past Medical History:  Diagnosis Date  . Complication of anesthesia   . Dizziness    occas  . Heart murmur   . History of infertility   . Obesity   . PONV (postoperative nausea and vomiting)   . Seasonal allergies   . Spontaneous pneumothorax     Past Surgical History:  Procedure Laterality Date  . ABLATION ON ENDOMETRIOSIS  2012  . LUNG SURGERY Left 1997 and 1999  . TONSILLECTOMY  1975  . ULNAR COLLATERAL LIGAMENT REPAIR Left 08/31/2017   Procedure: ULNAR COLLATERAL LIGAMENT REPAIR- LEFT THUMB;  Surgeon: Corky Mull, MD;  Location: [facility] ORS;  Service: Orthopedics;  Laterality: Left;    There were no vitals filed for this visit.  Subjective Assessment - 11/14/17 1737    Subjective   Doing better - I can tell a difference - using it more - not wearing splint at all - wrist still little stiff     Patient Stated Goals  Want to get the flexibility in my thumb back and strength to cut things, open containers , hold toothbrush, fasten bra, turn doorknob , switches     Currently in Pain?  No/denies         [facility] OT Assessment - 11/14/17 0001      Strength   Right Hand Lateral Pinch  18 lbs    Right Hand 3 Point Pinch  18 lbs    Left Hand Lateral Pinch  15 lbs    Left Hand 3 Point Pinch  12 lbs      Left Hand AROM   L  Thumb MCP 0-60  45 Degrees    L Thumb IP 0-80  46 Degrees    L Thumb Radial ADduction/ABduction 0-55  56    L Thumb Palmar ADduction/ABduction 0-45  60       assess thumb and wrist AROM and prehension strength   see flow sheet Great progress in prehension  And PA and RA            OT Treatments/Exercises (OP) - 11/14/17 0001      LUE Paraffin   Number Minutes Paraffin  0 Minutes    LUE Paraffin Location  Hand;Wrist    Comments  at [facility] - doing flexion and extention stretches several times 30 sec on and off       Scar massage done manual and vibration - great results on distal part  Pt to cont with cica scar pad -even with splint no on anymore  Pt weaned out of splint over weekend - for daytime and night time   AROM and PROM for PA , RA ,  IP and MP flexion  Composite flexion to  5th - 2nd fold   AAROM for wrist flexion and extention  Done and add 16 oz hammer for wrist in all planes - 12 reps  And increase over the week to 2-3 sets if not pain   And putty teal for gripping  12 reps  Can increase sets over the week  And to do 2 x day          OT Education - 11/14/17 1740    Education Details  upgrade HEP and add strengthening     Person(s) Educated  Patient    Methods  Explanation;Demonstration    Comprehension  Verbalized understanding;Returned demonstration       OT Short Term Goals - 10/31/17 1538      OT SHORT TERM GOAL #1   Title  Pt to be ind in HEP to decrease scar tissue, increase ROM in thumb , and wean out of thumb spica     Time  3    Period  Weeks    Status  New    Target Date  11/21/17      OT SHORT TERM GOAL #2   Title  L thumb AROM improve to pt to touch 2nd of of thumb to 1-2 cm objects out of palm     Baseline  see flowsheet     Time  3    Period  Weeks    Status  New    Target Date  11/21/17      OT SHORT TERM GOAL #3   Title  L thumb PA and RA improve for pt to hold 1/2 full glass , open doorknob, and wide grip objects  less than 1 lbs    Baseline   see flowsheet     Time  3    Period  Weeks    Status  New    Target Date  11/21/17        OT Long Term Goals - 10/31/17 1541      OT LONG TERM GOAL #1   Title  Prehension and grip strength in L hand improve to more than 75% compare to R hand to cut food, fasten bra, do switch pull up pans , nail clip    Baseline  Decrease see flowsheet     Time  4    Period  Weeks    Status  New    Target Date  11/28/17      OT LONG TERM GOAL #2   Title  Function score on PRHWE improve with more than 20 points     Baseline  functions score on PRWHE at eval 22/50     Time  4    Period  Weeks    Status  New    Target Date  11/28/17            Plan - 11/14/17 1741    Clinical Impression Statement  Pt made great progress in prehension strength - cont to have decrease wrist ROM and strength -and grip - pt did wean out of splint - add putty this date for grip and weight for wrist in all planes     Occupational performance deficits (Please refer to evaluation for details):  ADL's;IADL's;Work;Play;Leisure    Rehab Potential  Good    OT Frequency  1x / week    OT Duration  4 weeks    OT Treatment/Interventions  Self-care/ADL training;Therapeutic exercise;Patient/family education;Splinting;Paraffin;Manual Therapy;Passive range of motion;Scar mobilization;Fluidtherapy    Plan  assess progress  and grip /prehension - assess progress with strengthening     Clinical Decision Making  Several treatment options, min-mod task modification necessary    OT Home Exercise Plan  see pt instruction    Consulted and Agree with Plan of Care  Patient       Patient will benefit from skilled therapeutic intervention in order to improve the following deficits and impairments:  Impaired flexibility, Decreased scar mobility, Impaired UE functional use, Decreased strength, Decreased range of motion, Decreased coordination  Visit Diagnosis: Stiffness of left hand, not elsewhere  classified  Scar condition and fibrosis of skin  Muscle weakness (generalized)    Problem List Patient Active Problem List   Diagnosis Date Noted  . Vertigo 11/25/2013  . Esophageal reflux 11/25/2013  . Metrorrhagia 08/18/2010    Rosalyn Gess OTR/l,CLT 11/14/2017, 5:43 PM  [place] [facility] [address] [place], [address] Phone: [phone]   Fax:  [phone]  Name: BRAYLIN FORMBY MRN: [id number] Date of Birth: 10-Apr-1971

## 2017-11-22 ENCOUNTER — Ambulatory Visit: Payer: BLUE CROSS/BLUE SHIELD | Attending: Surgery | Admitting: Occupational Therapy

## 2017-11-22 DIAGNOSIS — M6281 Muscle weakness (generalized): Secondary | ICD-10-CM | POA: Diagnosis not present

## 2017-11-22 DIAGNOSIS — M25642 Stiffness of left hand, not elsewhere classified: Secondary | ICD-10-CM | POA: Insufficient documentation

## 2017-11-22 NOTE — Patient Instructions (Signed)
Same HEP   but increase weight at wrist to 3 lbs for all planes   12 reps  Increase over week to 2 sets   And then increase to green firm putty  12 reps  Putty for grip , lat and 3 point grip   12 reps each   2 x day  Work to 2 sets  Can add in week pulling and twisting

## 2017-11-22 NOTE — Therapy (Signed)
Tracy Booth [address] [place], [address] Phone: [phone]   Fax:  [phone]  Occupational Therapy Treatment  Patient Details  Name: Tracy Booth MRN: [id number] Date of Birth: [birth year] Referring Provider: Roland Rack   Encounter Date: 11/22/2017  OT End of Session - 11/22/17 1505    Visit Number  5    Number of Visits  8    Date for OT Re-Evaluation  11/28/17    OT Start Time  1433    OT Stop Time  1505    OT Time Calculation (min)  32 min    Activity Tolerance  Patient tolerated treatment well    Behavior During Therapy  [facility] for tasks assessed/performed       Past Medical History:  Diagnosis Date  . Complication of anesthesia   . Dizziness    occas  . Heart murmur   . History of infertility   . Obesity   . PONV (postoperative nausea and vomiting)   . Seasonal allergies   . Spontaneous pneumothorax     Past Surgical History:  Procedure Laterality Date  . ABLATION ON ENDOMETRIOSIS  2012  . LUNG SURGERY Left 1997 and 1999  . TONSILLECTOMY  1975  . ULNAR COLLATERAL LIGAMENT REPAIR Left 08/31/2017   Procedure: ULNAR COLLATERAL LIGAMENT REPAIR- LEFT THUMB;  Surgeon: Corky Mull, MD;  Location: [facility] OR[facility];  Service: Orthopedics;  Laterality: Left;    There were no vitals filed for this visit.  Subjective Assessment - 11/22/17 1501    Subjective   I felt here and there some twitching pain in my thumb the past week or 2 - but then its gone - using it more - mostly in everything     Patient Stated Goals  Want to get the flexibility in my thumb back and strength to cut things, open containers , hold toothbrush, fasten bra, turn doorknob , switches     Currently in Pain?  No/denies         [facility] OT Assessment - 11/22/17 0001      Strength   Right Hand Grip (lbs)  50    Right Hand Lateral Pinch  18 lbs    Right Hand 3 Point Pinch  18 lbs    Left Hand Grip (lbs)  40    Left Hand Lateral  Pinch  15 lbs    Left Hand 3 Point Pinch  12 lbs       assess grip and prehension - see flowsheet  thumb opposition to base of 5th  Strength 5/5 for thumb  In all planes         OT Treatments/Exercises (OP) - 11/22/17 0001      LUE Paraffin   Number Minutes Paraffin  8 Minutes    LUE Paraffin Location  Hand    Comments  At [facility] - coban flexion  wrap for thumb        Scar massage done manual and vibration - great results on distal part  Pt to cont with cica scar pad -even with splint no on anymore    PROM for Composite flexion to 5th - to proximal fold of 5th   AAROM for wrist flexion and extention  3 lbs for wrist in all planes - 12 reps  And increase over the week to 2-3 sets if not pain   And putty upgrade to green firm  for gripping , lat and 3 point grip  12 reps  Can increase sets over the week  And to do 2 x day  And can add pulling and twisting in week        OT Education - 11/22/17 1505    Education Details  upgrade putty and weight for wrist to 3 lbs     Person(s) Educated  Patient    Methods  Explanation;Demonstration    Comprehension  Verbalized understanding;Returned demonstration       OT Short Term Goals - 11/22/17 1511      OT SHORT TERM GOAL #1   Title  Pt to be ind in HEP to decrease scar tissue, increase ROM in thumb , and wean out of thumb spica     Status  Achieved      OT SHORT TERM GOAL #2   Title  L thumb AROM improve to pt to touch 2nd of of thumb to 1-2 cm objects out of palm     Status  Achieved        OT Long Term Goals - 11/22/17 1511      OT LONG TERM GOAL #1   Title  Prehension and grip strength in L hand improve to more than 75% compare to R hand to cut food, fasten bra, do switch pull up pans , nail clip    Status  Achieved      OT LONG TERM GOAL #2   Title  Function score on PRHWE improve with more than 20 points     Baseline  functions score on PRWHE at eval 22/50 -to be assess at discharge     Time  2     Period  Weeks    Status  On-going    Target Date  11/28/17            Plan - 11/22/17 1509    Clinical Impression Statement  Pt cont to show progress in thumb AROM , PROM - strength - as well as grip and prehension strength  - pt HEP upgrade to 3 lbs for wrist and med firm putty - to do for 2-3 wks and contact me if needed to be seen or discharge     Occupational performance deficits (Please refer to evaluation for details):  ADL's;IADL's;Work;Play;Leisure    Rehab Potential  Good    OT Frequency  Biweekly    OT Duration  4 weeks    OT Treatment/Interventions  Self-care/ADL training;Therapeutic exercise;Patient/family education;Splinting;Paraffin;Manual Therapy;Passive range of motion;Scar mobilization;Fluidtherapy    Plan  Pt to phone in 2-3 wks - how she is doing      Clinical Decision Making  Several treatment options, min-mod task modification necessary    OT Home Exercise Plan  see pt instruction    Consulted and Agree with Plan of Care  Patient       Patient will benefit from skilled therapeutic intervention in order to improve the following deficits and impairments:  Impaired flexibility, Decreased scar mobility, Impaired UE functional use, Decreased strength, Decreased range of motion, Decreased coordination  Visit Diagnosis: Stiffness of left hand, not elsewhere classified  Muscle weakness (generalized)    Problem List Patient Active Problem List   Diagnosis Date Noted  . Vertigo 11/25/2013  . Esophageal reflux 11/25/2013  . Metrorrhagia 08/18/2010    Tracy Booth OTR/l,CLT 11/22/2017, 3:13 PM  [place] [facility] [address], [place], [address] Phone: [phone]   Fax:  [phone]  Name: Tracy Booth  MRN: [id number] Date of Birth: Nov 16, 1970

## 2017-12-01 DIAGNOSIS — S63642D Sprain of metacarpophalangeal joint of left thumb, subsequent encounter: Secondary | ICD-10-CM | POA: Diagnosis not present

## 2018-03-18 ENCOUNTER — Other Ambulatory Visit: Payer: Self-pay | Admitting: Physician Assistant

## 2018-03-19 NOTE — Telephone Encounter (Signed)
Patient due for CPE

## 2018-04-27 ENCOUNTER — Other Ambulatory Visit: Payer: Self-pay | Admitting: Physician Assistant

## 2018-04-27 DIAGNOSIS — Z1231 Encounter for screening mammogram for malignant neoplasm of breast: Secondary | ICD-10-CM

## 2018-05-18 ENCOUNTER — Telehealth: Payer: Self-pay | Admitting: *Deleted

## 2018-05-18 ENCOUNTER — Ambulatory Visit
Admission: RE | Admit: 2018-05-18 | Discharge: 2018-05-18 | Disposition: A | Discharge status: Home / Self Care | Payer: BLUE CROSS/BLUE SHIELD | Source: Ambulatory Visit | Attending: Physician Assistant | Admitting: Physician Assistant

## 2018-05-18 DIAGNOSIS — Z1231 Encounter for screening mammogram for malignant neoplasm of breast: Secondary | ICD-10-CM

## 2018-05-18 NOTE — Telephone Encounter (Signed)
LMOVM for pt to return call 

## 2018-05-18 NOTE — Telephone Encounter (Signed)
-----   Message from Mar Daring, [place] sent at 05/18/2018 11:49 AM EST ----- Normal mammogram. Repeat screening in one year.

## 2018-05-21 NOTE — Telephone Encounter (Signed)
Patient advised as directed below. 

## 2018-05-25 ENCOUNTER — Other Ambulatory Visit: Payer: Self-pay | Admitting: Physician Assistant

## 2018-05-25 DIAGNOSIS — F3341 Major depressive disorder, recurrent, in partial remission: Secondary | ICD-10-CM

## 2018-06-08 ENCOUNTER — Encounter: Payer: BLUE CROSS/BLUE SHIELD | Admitting: Physician Assistant

## 2018-07-06 ENCOUNTER — Encounter: Payer: BLUE CROSS/BLUE SHIELD | Admitting: Physician Assistant

## 2018-07-17 ENCOUNTER — Other Ambulatory Visit: Payer: Self-pay | Admitting: *Deleted

## 2018-07-17 DIAGNOSIS — N951 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: Secondary | ICD-10-CM

## 2018-07-17 MED ORDER — NORETHINDRONE ACETATE 5 MG PO TABS: 5.0000 mg | ORAL_TABLET | Freq: Every day | ORAL | 0 refills | Status: DC

## 2018-07-17 NOTE — Telephone Encounter (Signed)
Patient is requesting refill for norethindrone 5 mg qd. [facility].

## 2018-07-17 NOTE — Telephone Encounter (Signed)
Please review

## 2018-08-23 NOTE — Progress Notes (Signed)
Patient: Tracy Booth, Female    DOB: Mar 15, 1971, 48 y.o.   MRN: [id number] Visit Date: 08/24/2018  Today's Provider: Mar Daring, PA-C   Chief Complaint  Patient presents with  . Annual Exam   Subjective:     Annual physical exam Tracy Booth is a 48 y.o. female who presents today for health maintenance and complete physical. She feels well. She reports exercising some. She reports she is sleeping well. ----------------------------------------------------------------- Mammogram:05/18/2018-Normal   Review of Systems  Constitutional: Negative.   HENT: Positive for ear discharge.   Eyes: Positive for itching.  Respiratory: Negative.   Cardiovascular: Negative.   Gastrointestinal: Negative.   Endocrine: Negative.   Genitourinary: Positive for genital sores.  Musculoskeletal: Positive for arthralgias.  Skin: Negative.   Allergic/Immunologic: Positive for environmental allergies.  Neurological: Negative.   Hematological: Negative.   Psychiatric/Behavioral: Negative.     Social History      She  reports that she has never smoked. She has never used smokeless tobacco. She reports current alcohol use. She reports that she does not use drugs.       Social History   Socioeconomic History  . Marital status: Married    Spouse name: Not on file  . Number of children: Not on file  . Years of education: Not on file  . Highest education level: Not on file  Occupational History  . Not on file  Social Needs  . Financial resource strain: Not on file  . Food insecurity:    Worry: Not on file    Inability: Not on file  . Transportation needs:    Medical: Not on file    Non-medical: Not on file  Tobacco Use  . Smoking status: Never Smoker  . Smokeless tobacco: Never Used  Substance and Sexual Activity  . Alcohol use: Yes    Comment: rare  . Drug use: No  . Sexual activity: Not on file  Lifestyle  . Physical activity:    Days per week: 3 days   Minutes per session: 40 min  . Stress: Not on file  Relationships  . Social connections:    Talks on phone: Not on file    Gets together: Not on file    Attends religious service: Not on file    Active member of club or organization: Not on file    Attends meetings of clubs or organizations: Not on file    Relationship status: Not on file  Other Topics Concern  . Not on file  Social History Narrative  . Not on file    Past Medical History:  Diagnosis Date  . Complication of anesthesia   . Dizziness    occas  . Heart murmur   . History of infertility   . Obesity   . PONV (postoperative nausea and vomiting)   . Seasonal allergies   . Spontaneous pneumothorax      Patient Active Problem List   Diagnosis Date Noted  . Vertigo 11/25/2013  . Esophageal reflux 11/25/2013  . Metrorrhagia 08/18/2010    Past Surgical History:  Procedure Laterality Date  . ABLATION ON ENDOMETRIOSIS  2012  . LUNG SURGERY Left 1997 and 1999  . TONSILLECTOMY  1975  . ULNAR COLLATERAL LIGAMENT REPAIR Left 08/31/2017   Procedure: ULNAR COLLATERAL LIGAMENT REPAIR- LEFT THUMB;  Surgeon: Corky Mull, MD;  Location: [facility] ORS;  Service: Orthopedics;  Laterality: Left;    Family History  Family Status  Relation Name Status  . Mother  Alive  . Father  Alive  . MGM  (Not Specified)  . Brother  Alive  . Son  Alive        Her family history includes Arthritis in her mother; Breast cancer in her maternal grandmother; Fibromyalgia in her mother; Hypertension in her mother.      Not on File   Current Outpatient Medications:  .  acetaminophen (TYLENOL) 500 MG tablet, Take 500 mg by mouth daily as needed for moderate pain or headache., Disp: , Rfl:  .  cetirizine (ZYRTEC) 10 MG tablet, Take 10 mg by mouth daily as needed for allergies., Disp: , Rfl:  .  escitalopram (LEXAPRO) 10 MG tablet, TAKE 1 TABLET AT BEDTIME, Disp: 90 tablet, Rfl: 4 .  famotidine (PEPCID AC) 10 MG tablet, Take 10 mg by  mouth daily as needed for heartburn or indigestion., Disp: , Rfl:  .  HYDROcodone-acetaminophen (NORCO) 5-325 MG tablet, Take 1-2 tablets by mouth every 6 (six) hours as needed for moderate pain., Disp: 30 tablet, Rfl: 0 .  norethindrone (AYGESTIN) 5 MG tablet, Take 1 tablet (5 mg total) by mouth at bedtime. Needs appt before more refills, Disp: 90 tablet, Rfl: 0 .  Probiotic CAPS, Take 2 capsules by mouth at bedtime., Disp: , Rfl:    Patient Care Team: Mar Daring, PA-C as PCP - General (Family Medicine)    Objective:    Vitals: BP 139/87 (BP Location: Left Arm, Patient Position: Sitting, Cuff Size: Large)   Resp 16   Ht 5\' 4"  (1.626 m)   Wt 217 lb (98.4 kg)   BMI 37.25 kg/m    Vitals:   08/24/18 1411  BP: 139/87  Resp: 16  Weight: 217 lb (98.4 kg)  Height: 5\' 4"  (1.626 m)     Physical Exam Vitals signs reviewed.  Constitutional:      General: She is not in acute distress.    Appearance: Normal appearance. She is well-developed. She is obese. She is not ill-appearing or diaphoretic.  HENT:     Head: Normocephalic and atraumatic.     Right Ear: Hearing, tympanic membrane, ear canal and external ear normal.     Left Ear: Hearing, tympanic membrane, ear canal and external ear normal.     Nose: Nose normal.     Mouth/Throat:     Mouth: Mucous membranes are moist.     Pharynx: Oropharynx is clear. Uvula midline. No oropharyngeal exudate.  Eyes:     General: No scleral icterus.       Right eye: No discharge.        Left eye: No discharge.     Extraocular Movements: Extraocular movements intact.     Conjunctiva/sclera: Conjunctivae normal.     Pupils: Pupils are equal, round, and reactive to light.  Neck:     Musculoskeletal: Normal range of motion and neck supple.     Thyroid: No thyromegaly.     Vascular: No carotid bruit or JVD.     Trachea: No tracheal deviation.  Cardiovascular:     Rate and Rhythm: Normal rate and regular rhythm.     Pulses: Normal  pulses.     Heart sounds: Normal heart sounds. No murmur. No friction rub. No gallop.   Pulmonary:     Effort: Pulmonary effort is normal. No respiratory distress.     Breath sounds: Normal breath sounds. No wheezing or rales.  Chest:  Chest wall: No tenderness.     Breasts: Breasts are symmetrical.        Right: No inverted nipple, mass, nipple discharge, skin change or tenderness.        Left: No inverted nipple, mass, nipple discharge, skin change or tenderness.  Abdominal:     General: Bowel sounds are normal. There is no distension.     Palpations: Abdomen is soft. There is no mass.     Tenderness: There is no abdominal tenderness. There is no guarding or rebound.     Hernia: There is no hernia in the right inguinal area or left inguinal area.  Genitourinary:    Exam position: Supine.     Labia:        Right: No rash, tenderness, lesion or injury.        Left: No rash, tenderness, lesion or injury.      Vagina: Normal. No signs of injury. No vaginal discharge, erythema, tenderness or bleeding.     Cervix: No cervical motion tenderness, discharge or friability.     Adnexa:        Right: No mass, tenderness or fullness.         Left: No mass, tenderness or fullness.       Rectum: Normal.  Musculoskeletal: Normal range of motion.        General: No tenderness.  Lymphadenopathy:     Cervical: No cervical adenopathy.  Skin:    General: Skin is warm and dry.     Capillary Refill: Capillary refill takes less than 2 seconds.     Findings: No rash.  Neurological:     General: No focal deficit present.     Mental Status: She is alert and oriented to person, place, and time.     Cranial Nerves: No cranial nerve deficit.     Coordination: Coordination normal.     Deep Tendon Reflexes: Reflexes are normal and symmetric.  Psychiatric:        Mood and Affect: Mood normal.        Behavior: Behavior normal.        Thought Content: Thought content normal.        Judgment: Judgment  normal.     Depression Screen PHQ 2/9 Scores 08/24/2018 03/17/2017  PHQ - 2 Score 1 0  PHQ- 9 Score 7 2       Assessment & Plan:     Routine Health Maintenance and Physical Exam  Exercise Activities and Dietary recommendations Goals   None     Immunization History  Administered Date(s) Administered  . Tdap 11/25/2012    Health Maintenance  Topic Date Due  . PAP SMEAR-Modifier  12/25/2017  . COLONOSCOPY  08/14/2018  . INFLUENZA VACCINE  11/17/2018  . MAMMOGRAM  05/18/2020  . TETANUS/TDAP  11/26/2022  . HIV Screening  Completed     Discussed health benefits of physical activity, and encouraged her to engage in regular exercise appropriate for her age and condition.    1. Annual physical exam Normal physical exam today. Will check labs as below and f/u pending lab results. If labs are stable and WNL she will not need to have these rechecked for one year at her next annual physical exam. She is to call the office in the meantime if she has any acute issue, questions or concerns. - CBC with Differential/Platelet - Comprehensive metabolic panel - Hemoglobin A1c - Lipid panel - TSH  2. Breast cancer screening Mammogram in January was  normal.   3. Encounter for lipid screening for cardiovascular disease Will check labs as below and f/u pending results. - Lipid panel  4. Cervical cancer screening Pap collected today. Will send as below and f/u pending results. - Cytology - PAP  5. Colon cancer screening Due for colon cancer screening. Prefers Cologuard.  - Cologuard  6. Class 2 severe obesity due to excess calories with serious comorbidity and body mass index (BMI) of 37.0 to 37.9 in adult [facility]) Counseled patient on healthy lifestyle modifications including dieting and exercise.  - Comprehensive metabolic panel - Hemoglobin A1c - Lipid panel - TSH  7. Recurrent major depressive disorder, in partial remission (HCC) Stable on Lexapro 10mg .  - TSH  8.  Acute pain of left knee New problem. Worsening over last few months. Worsening with walking and prolonged standing. Will get imaging as below to see if OA present.  - DG Knee Complete 4 Views Left; Future  --------------------------------------------------------------------    Mar Daring, PA-C  [facility]

## 2018-08-24 ENCOUNTER — Encounter: Payer: Self-pay | Admitting: Physician Assistant

## 2018-08-24 ENCOUNTER — Ambulatory Visit (INDEPENDENT_AMBULATORY_CARE_PROVIDER_SITE_OTHER): Payer: BLUE CROSS/BLUE SHIELD | Admitting: Physician Assistant

## 2018-08-24 ENCOUNTER — Ambulatory Visit
Admission: RE | Admit: 2018-08-24 | Discharge: 2018-08-24 | Disposition: A | Discharge status: Home / Self Care | Payer: BLUE CROSS/BLUE SHIELD | Source: Ambulatory Visit | Attending: Physician Assistant | Admitting: Physician Assistant

## 2018-08-24 ENCOUNTER — Other Ambulatory Visit: Payer: Self-pay

## 2018-08-24 ENCOUNTER — Other Ambulatory Visit (HOSPITAL_COMMUNITY)
Admission: RE | Admit: 2018-08-24 | Discharge: 2018-08-24 | Disposition: A | Discharge status: Home / Self Care | Payer: BLUE CROSS/BLUE SHIELD | Source: Ambulatory Visit | Attending: Physician Assistant | Admitting: Physician Assistant

## 2018-08-24 VITALS — BP 139/87 | Resp 16 | Ht 64.0 in | Wt 217.0 lb

## 2018-08-24 DIAGNOSIS — Z136 Encounter for screening for cardiovascular disorders: Secondary | ICD-10-CM | POA: Diagnosis not present

## 2018-08-24 DIAGNOSIS — Z13220 Encounter for screening for lipoid disorders: Secondary | ICD-10-CM

## 2018-08-24 DIAGNOSIS — M25562 Pain in left knee: Secondary | ICD-10-CM | POA: Diagnosis not present

## 2018-08-24 DIAGNOSIS — Z6837 Body mass index (BMI) 37.0-37.9, adult: Secondary | ICD-10-CM | POA: Diagnosis not present

## 2018-08-24 DIAGNOSIS — Z124 Encounter for screening for malignant neoplasm of cervix: Secondary | ICD-10-CM

## 2018-08-24 DIAGNOSIS — Z1239 Encounter for other screening for malignant neoplasm of breast: Secondary | ICD-10-CM

## 2018-08-24 DIAGNOSIS — Z0000 Encounter for general adult medical examination without abnormal findings: Secondary | ICD-10-CM

## 2018-08-24 DIAGNOSIS — Z1211 Encounter for screening for malignant neoplasm of colon: Secondary | ICD-10-CM

## 2018-08-24 DIAGNOSIS — F3341 Major depressive disorder, recurrent, in partial remission: Secondary | ICD-10-CM

## 2018-08-24 DIAGNOSIS — R195 Other fecal abnormalities: Secondary | ICD-10-CM

## 2018-08-24 NOTE — Patient Instructions (Signed)

## 2018-08-25 LAB — COMPREHENSIVE METABOLIC PANEL
ALT: 14 IU/L (ref 0–32)
AST: 18 IU/L (ref 0–40)
Albumin/Globulin Ratio: 1.8 (ref 1.2–2.2)
Albumin: 4.1 g/dL (ref 3.8–4.8)
Alkaline Phosphatase: 74 IU/L (ref 39–117)
BUN/Creatinine Ratio: 8 — ABNORMAL LOW (ref 9–23)
BUN: 8 mg/dL (ref 6–24)
Bilirubin Total: 0.3 mg/dL (ref 0.0–1.2)
CO2: 20 mmol/L (ref 20–29)
Calcium: 8.5 mg/dL — ABNORMAL LOW (ref 8.7–10.2)
Chloride: 107 mmol/L — ABNORMAL HIGH (ref 96–106)
Creatinine, Ser: 0.98 mg/dL (ref 0.57–1.00)
GFR calc Af Amer: 78 mL/min/{1.73_m2} (ref >59)
GFR calc non Af Amer: 67 mL/min/{1.73_m2} (ref >59)
Globulin, Total: 2.3 g/dL (ref 1.5–4.5)
Glucose: 80 mg/dL (ref 65–99)
Potassium: 4.4 mmol/L (ref 3.5–5.2)
Sodium: 142 mmol/L (ref 134–144)
Total Protein: 6.4 g/dL (ref 6.0–8.5)

## 2018-08-25 LAB — LIPID PANEL
Chol/HDL Ratio: 4.9 ratio — ABNORMAL HIGH (ref 0.0–4.4)
Cholesterol, Total: 210 mg/dL — ABNORMAL HIGH (ref 100–199)
HDL: 43 mg/dL (ref >39)
LDL Calculated: 150 mg/dL — ABNORMAL HIGH (ref 0–99)
Triglycerides: 87 mg/dL (ref 0–149)
VLDL Cholesterol Cal: 17 mg/dL (ref 5–40)

## 2018-08-25 LAB — CBC WITH DIFFERENTIAL/PLATELET
Basophils Absolute: 0.1 10*3/uL (ref 0.0–0.2)
Basos: 1 %
EOS (ABSOLUTE): 0.1 10*3/uL (ref 0.0–0.4)
Eos: 1 %
Hematocrit: 39.8 % (ref 34.0–46.6)
Hemoglobin: 14.1 g/dL (ref 11.1–15.9)
Immature Grans (Abs): 0 10*3/uL (ref 0.0–0.1)
Immature Granulocytes: 0 %
Lymphocytes Absolute: 1.6 10*3/uL (ref 0.7–3.1)
Lymphs: 24 %
MCH: 31.1 pg (ref 26.6–33.0)
MCHC: 35.4 g/dL (ref 31.5–35.7)
MCV: 88 fL (ref 79–97)
Monocytes Absolute: 0.5 10*3/uL (ref 0.1–0.9)
Monocytes: 7 %
Neutrophils Absolute: 4.2 10*3/uL (ref 1.4–7.0)
Neutrophils: 67 %
Platelets: 359 10*3/uL (ref 150–450)
RBC: 4.54 x10E6/uL (ref 3.77–5.28)
RDW: 12.3 % (ref 11.7–15.4)
WBC: 6.4 10*3/uL (ref 3.4–10.8)

## 2018-08-25 LAB — HEMOGLOBIN A1C
Est. average glucose Bld gHb Est-mCnc: 105 mg/dL
Hgb A1c MFr Bld: 5.3 % (ref 4.8–5.6)

## 2018-08-25 LAB — TSH: TSH: 1.59 u[IU]/mL (ref 0.450–4.500)

## 2018-08-27 ENCOUNTER — Telehealth: Payer: Self-pay

## 2018-08-27 NOTE — Telephone Encounter (Signed)
Cologuard orders faxed to [facility] with patient's/insurance and demographic information.

## 2018-08-28 ENCOUNTER — Telehealth: Payer: Self-pay | Admitting: *Deleted

## 2018-08-28 NOTE — Telephone Encounter (Signed)
See both results below! LMOVM for pt to return call.

## 2018-08-28 NOTE — Telephone Encounter (Signed)
-----   Message from Mar Daring, [place] sent at 08/27/2018  1:24 PM EDT ----- All labs are within normal limits and stable.  Cholesterol appears to be up slightly but that is due to non fasting so it is ok. Thanks! -JB

## 2018-08-28 NOTE — Telephone Encounter (Signed)
Patient was notified of results. Expressed understanding.  

## 2018-08-28 NOTE — Telephone Encounter (Signed)
LMOVM for pt to return call 

## 2018-08-28 NOTE — Telephone Encounter (Signed)
-----   Message from Mar Daring, [place] sent at 08/27/2018  1:14 PM EDT ----- Knee xray is actually normal. No arthritic changes. Look up patellofemoral syndrome. That is probably what is causing the knee pain.

## 2018-08-29 LAB — CYTOLOGY - PAP
Adequacy: ABSENT
Diagnosis: NEGATIVE
HPV: NOT DETECTED

## 2018-08-30 ENCOUNTER — Telehealth: Payer: Self-pay

## 2018-08-30 NOTE — Telephone Encounter (Signed)
Patient was notified of results. Expressed understanding.  

## 2018-08-30 NOTE — Telephone Encounter (Signed)
LMTCB

## 2018-08-30 NOTE — Telephone Encounter (Signed)
-----   Message from Mar Daring, [place] sent at 08/30/2018 12:15 PM EDT ----- Pap is normal, HPV negative.  Will repeat in 3-5 years.

## 2018-09-12 DIAGNOSIS — Z1211 Encounter for screening for malignant neoplasm of colon: Secondary | ICD-10-CM | POA: Diagnosis not present

## 2018-09-19 LAB — COLOGUARD: Cologuard: POSITIVE — AB

## 2018-09-21 ENCOUNTER — Telehealth: Payer: Self-pay

## 2018-09-21 NOTE — Telephone Encounter (Signed)
LMTCB

## 2018-09-21 NOTE — Telephone Encounter (Signed)
-----   Message from Mar Daring, [place] sent at 09/21/2018  1:26 PM EDT ----- Cologuard testing was positive. Will refer to GI.

## 2018-09-21 NOTE — Addendum Note (Signed)
Addended by: Mar Daring on: 09/21/2018 01:27 PM   Modules accepted: Orders

## 2018-09-21 NOTE — Telephone Encounter (Signed)
Patient advised as directed below. 

## 2018-10-09 ENCOUNTER — Telehealth: Payer: Self-pay | Admitting: Physician Assistant

## 2018-10-09 NOTE — Telephone Encounter (Signed)
Pt wanting to let Joseline know she hasn't heard back from colonoscopy office to schedule her colonoscopy.  Thanks, [employer]

## 2018-10-11 NOTE — Telephone Encounter (Signed)
Patient was advised that GI office will contact her. Per patient she has not heard anything from them. Patient was given the office number

## 2018-10-16 ENCOUNTER — Other Ambulatory Visit: Payer: Self-pay | Admitting: Physician Assistant

## 2018-10-16 DIAGNOSIS — N951 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: Secondary | ICD-10-CM

## 2018-10-18 ENCOUNTER — Other Ambulatory Visit: Payer: Self-pay

## 2018-10-18 ENCOUNTER — Telehealth: Payer: Self-pay

## 2018-10-18 DIAGNOSIS — R195 Other fecal abnormalities: Secondary | ICD-10-CM

## 2018-10-18 NOTE — Telephone Encounter (Signed)
Gastroenterology Pre-Procedure Review  Request Date: 11/02/18 Requesting Physician: Dr. Vicente Males  PATIENT REVIEW QUESTIONS: The patient responded to the following health history questions as indicated:    1. Are you having any GI issues? no (Referral for positive cologuard test) 2. Do you have a personal history of Polyps? no 3. Do you have a family history of Colon Cancer or Polyps? no 4. Diabetes Mellitus? no 5. Joint replacements in the past 12 months?no Thumb Surgery May 2019 6. Major health problems in the past 3 months?no 7. Any artificial heart valves, MVP, or defibrillator?no    MEDICATIONS & ALLERGIES:    Patient reports the following regarding taking any anticoagulation/antiplatelet therapy:   Plavix, Coumadin, Eliquis, Xarelto, Lovenox, Pradaxa, Brilinta, or Effient? no Aspirin? no  Patient confirms/reports the following medications:  Current Outpatient Medications  Medication Sig Dispense Refill  . acetaminophen (TYLENOL) 500 MG tablet Take 500 mg by mouth daily as needed for moderate pain or headache.    . cetirizine (ZYRTEC) 10 MG tablet Take 10 mg by mouth daily as needed for allergies.    Marland Kitchen escitalopram (LEXAPRO) 10 MG tablet TAKE 1 TABLET AT BEDTIME 90 tablet 4  . famotidine (PEPCID AC) 10 MG tablet Take 10 mg by mouth daily as needed for heartburn or indigestion.    . norethindrone (AYGESTIN) 5 MG tablet Take 1 tablet (5 mg total) by mouth at bedtime. 90 tablet 3  . Probiotic CAPS Take 2 capsules by mouth at bedtime.     No current facility-administered medications for this visit.     Patient confirms/reports the following allergies:  Not on File  No orders of the defined types were placed in this encounter.   AUTHORIZATION INFORMATION Primary Insurance: 1D#: Group #:  Secondary Insurance: 1D#: Group #:  SCHEDULE INFORMATION: Date:  Time: Location:

## 2018-10-24 ENCOUNTER — Other Ambulatory Visit: Payer: Self-pay

## 2018-10-24 DIAGNOSIS — N951 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: Secondary | ICD-10-CM

## 2018-10-24 MED ORDER — NORETHINDRONE ACETATE 5 MG PO TABS: 5.0000 mg | ORAL_TABLET | Freq: Every day | ORAL | 3 refills | Status: DC

## 2018-10-24 NOTE — Telephone Encounter (Signed)
Pt states this prescription was sent to [facility], but it will need to go to [facility].    Contact Number: [phone]   Thanks,   -Mickel Baas

## 2018-10-30 ENCOUNTER — Other Ambulatory Visit: Admission: RE | Admit: 2018-10-30 | Payer: BC Managed Care – PPO | Source: Ambulatory Visit

## 2018-10-30 ENCOUNTER — Telehealth: Payer: Self-pay | Admitting: Gastroenterology

## 2018-10-30 NOTE — Telephone Encounter (Signed)
PATIENT CALLED TO R/S COLONOSCOPY SCHEDULED FOR 11-02-18 WITH DR Vicente Males. SHE WOULD LIKE R/S TO 7-31/8-14 OR ANY FRIDAY AFTER 11-30-18.

## 2018-10-31 NOTE — Telephone Encounter (Signed)
Returned patients call.  LVM for her to let her know I've received her message regarding rescheduling 11/01/18 colonoscopy with Dr. Vicente Males to another Friday.  The date that I would like to reschedule her to is 11/16/18.  Will advise her on COVID test date once procedure date change has been discussed and confirmed.  Thanks [employer]

## 2018-11-01 NOTE — Telephone Encounter (Signed)
Spoke with Tracy Booth and was able to reschedule her colonoscopy to 11-16-18. Tracy Booth is aware that the COVID test is required on 11-14-18.

## 2018-11-13 ENCOUNTER — Other Ambulatory Visit
Admission: RE | Admit: 2018-11-13 | Discharge: 2018-11-13 | Disposition: A | Discharge status: Home / Self Care | Payer: BC Managed Care – PPO | Source: Ambulatory Visit | Attending: Gastroenterology | Admitting: Gastroenterology

## 2018-11-14 ENCOUNTER — Telehealth: Payer: Self-pay

## 2018-11-14 NOTE — Telephone Encounter (Signed)
Patients colonoscopy has been rescheduled from 07/31 to 08/21 due to recent vacation out of the country.  Advised patient to have COVID test on Tue 12/04/18.  Thanks [employer]

## 2018-12-04 ENCOUNTER — Other Ambulatory Visit
Admission: RE | Admit: 2018-12-04 | Discharge: 2018-12-04 | Disposition: A | Discharge status: Home / Self Care | Payer: BC Managed Care – PPO | Source: Ambulatory Visit | Attending: Gastroenterology | Admitting: Gastroenterology

## 2018-12-04 DIAGNOSIS — Z01812 Encounter for preprocedural laboratory examination: Secondary | ICD-10-CM | POA: Insufficient documentation

## 2018-12-04 DIAGNOSIS — Z20828 Contact with and (suspected) exposure to other viral communicable diseases: Secondary | ICD-10-CM | POA: Diagnosis not present

## 2018-12-05 LAB — SARS CORONAVIRUS 2 (TAT 6-24 HRS): SARS Coronavirus 2: NEGATIVE

## 2018-12-07 ENCOUNTER — Ambulatory Visit: Payer: BC Managed Care – PPO | Admitting: Anesthesiology

## 2018-12-07 ENCOUNTER — Ambulatory Visit
Admission: RE | Admit: 2018-12-07 | Discharge: 2018-12-07 | Disposition: A | Discharge status: Home / Self Care | Payer: BC Managed Care – PPO | Attending: Gastroenterology | Admitting: Gastroenterology

## 2018-12-07 ENCOUNTER — Encounter
Admission: RE | Disposition: A | Discharge status: Home / Self Care | Payer: Self-pay | Source: Home / Self Care | Attending: Gastroenterology

## 2018-12-07 ENCOUNTER — Encounter: Payer: Self-pay | Admitting: *Deleted

## 2018-12-07 DIAGNOSIS — D125 Benign neoplasm of sigmoid colon: Secondary | ICD-10-CM | POA: Diagnosis not present

## 2018-12-07 DIAGNOSIS — D122 Benign neoplasm of ascending colon: Secondary | ICD-10-CM | POA: Diagnosis not present

## 2018-12-07 DIAGNOSIS — F329 Major depressive disorder, single episode, unspecified: Secondary | ICD-10-CM | POA: Insufficient documentation

## 2018-12-07 DIAGNOSIS — D123 Benign neoplasm of transverse colon: Secondary | ICD-10-CM | POA: Diagnosis not present

## 2018-12-07 DIAGNOSIS — D126 Benign neoplasm of colon, unspecified: Secondary | ICD-10-CM | POA: Diagnosis not present

## 2018-12-07 DIAGNOSIS — E669 Obesity, unspecified: Secondary | ICD-10-CM | POA: Insufficient documentation

## 2018-12-07 DIAGNOSIS — R195 Other fecal abnormalities: Secondary | ICD-10-CM

## 2018-12-07 DIAGNOSIS — K219 Gastro-esophageal reflux disease without esophagitis: Secondary | ICD-10-CM | POA: Insufficient documentation

## 2018-12-07 DIAGNOSIS — Z6835 Body mass index (BMI) 35.0-35.9, adult: Secondary | ICD-10-CM | POA: Insufficient documentation

## 2018-12-07 DIAGNOSIS — Z793 Long term (current) use of hormonal contraceptives: Secondary | ICD-10-CM | POA: Insufficient documentation

## 2018-12-07 DIAGNOSIS — K635 Polyp of colon: Secondary | ICD-10-CM

## 2018-12-07 DIAGNOSIS — Z79899 Other long term (current) drug therapy: Secondary | ICD-10-CM | POA: Diagnosis not present

## 2018-12-07 HISTORY — PX: COLONOSCOPY WITH PROPOFOL: SHX5780

## 2018-12-07 LAB — POCT PREGNANCY, URINE: Preg Test, Ur: NEGATIVE

## 2018-12-07 SURGERY — COLONOSCOPY WITH PROPOFOL
Anesthesia: General

## 2018-12-07 MED ORDER — PROPOFOL 500 MG/50ML IV EMUL
INTRAVENOUS | Status: DC | PRN
  Administered 2018-12-07: 150 ug/kg/min via INTRAVENOUS

## 2018-12-07 MED ORDER — PROPOFOL 10 MG/ML IV BOLUS
INTRAVENOUS | Status: DC | PRN
  Administered 2018-12-07: 70 mg via INTRAVENOUS

## 2018-12-07 MED ORDER — PROPOFOL 10 MG/ML IV BOLUS: INTRAVENOUS | Status: DC | PRN

## 2018-12-07 MED ORDER — SPOT INK MARKER SYRINGE KIT
PACK | SUBMUCOSAL | Status: DC | PRN
  Administered 2018-12-07: 4 mL via SUBMUCOSAL

## 2018-12-07 MED ORDER — SODIUM CHLORIDE 0.9 % IV SOLN
INTRAVENOUS | Status: DC
  Administered 2018-12-07: 1000 mL via INTRAVENOUS

## 2018-12-07 NOTE — H&P (Signed)
Tracy Bellows, MD [address], [place], [place], [place], [address] [address], [place], [place], [place], [address] Phone: [phone]  Fax: [phone]  Primary Care Physician:  Mar Daring, PA-C   Pre-Procedure History & Physical: HPI:  Tracy Booth is a 48 y.o. female is here for an colonoscopy.   Past Medical History:  Diagnosis Date  . Complication of anesthesia   . Dizziness    occas  . Heart murmur   . History of infertility   . Obesity   . PONV (postoperative nausea and vomiting)   . Seasonal allergies   . Spontaneous pneumothorax     Past Surgical History:  Procedure Laterality Date  . ABLATION ON ENDOMETRIOSIS  2012  . LUNG SURGERY Left 1997 and 1999  . TONSILLECTOMY  1975  . ULNAR COLLATERAL LIGAMENT REPAIR Left 08/31/2017   Procedure: ULNAR COLLATERAL LIGAMENT REPAIR- LEFT THUMB;  Surgeon: Corky Mull, MD;  Location: [facility] ORS;  Service: Orthopedics;  Laterality: Left;    Prior to Admission medications   Medication Sig Start Date End Date Taking? Authorizing Provider  acetaminophen (TYLENOL) 500 MG tablet Take 500 mg by mouth daily as needed for moderate pain or headache.   Yes [provider]  cetirizine (ZYRTEC) 10 MG tablet Take 10 mg by mouth daily as needed for allergies.   Yes [provider]  escitalopram (LEXAPRO) 10 MG tablet TAKE 1 TABLET AT BEDTIME 05/25/18  Yes Mar Daring, PA-C  famotidine (PEPCID AC) 10 MG tablet Take 10 mg by mouth daily as needed for heartburn or indigestion.   Yes [provider]  norethindrone (AYGESTIN) 5 MG tablet Take 1 tablet (5 mg total) by mouth at bedtime. 10/24/18  Yes Mar Daring, PA-C  Probiotic CAPS Take 2 capsules by mouth at bedtime.   Yes [provider]    Allergies as of 10/18/2018  . (Not on File)    Family History  Problem Relation Age of Onset  . Hypertension Mother   . Arthritis Mother   . Fibromyalgia Mother   . Breast  cancer Maternal Grandmother     Social History   Socioeconomic History  . Marital status: Married    Spouse name: Not on file  . Number of children: Not on file  . Years of education: Not on file  . Highest education level: Not on file  Occupational History  . Not on file  Social Needs  . Financial resource strain: Not on file  . Food insecurity    Worry: Not on file    Inability: Not on file  . Transportation needs    Medical: Not on file    Non-medical: Not on file  Tobacco Use  . Smoking status: Never Smoker  . Smokeless tobacco: Never Used  Substance and Sexual Activity  . Alcohol use: Yes    Comment: rare  . Drug use: Never  . Sexual activity: Not on file  Lifestyle  . Physical activity    Days per week: 3 days    Minutes per session: 40 min  . Stress: Not on file  Relationships  . Social Herbalist on phone: Not on file    Gets together: Not on file    Attends religious service: Not on file    Active member of club or organization: Not on file    Attends meetings of clubs or organizations: Not on file    Relationship status:  Not on file  . Intimate partner violence    Fear of current or ex partner: Not on file    Emotionally abused: Not on file    Physically abused: Not on file    Forced sexual activity: Not on file  Other Topics Concern  . Not on file  Social History Narrative  . Not on file    Review of Systems: See HPI, otherwise negative ROS  Physical Exam: BP (!) 179/77   Pulse 72   Temp 97.7 F (36.5 C) (Tympanic)   Resp 18   Ht 5\' 4"  (1.626 m)   Wt 93 kg   SpO2 99%   BMI 35.19 kg/m  General:   Alert,  pleasant and cooperative in NAD Head:  Normocephalic and atraumatic. Neck:  Supple; no masses or thyromegaly. Lungs:  Clear throughout to auscultation, normal respiratory effort.    Heart:  +S1, +S2, Regular rate and rhythm, No edema. Abdomen:  Soft, nontender and nondistended. Normal bowel sounds, without guarding, and  without rebound.   Neurologic:  Alert and  oriented x4;  grossly normal neurologically.  Impression/Plan: Tracy Booth is here for an colonoscopy to be performed for positive cologuard  Risks, benefits, limitations, and alternatives regarding  colonoscopy have been reviewed with the patient.  Questions have been answered.  All parties agreeable.   Tracy Bellows, MD  12/07/2018, 10:07 AM

## 2018-12-07 NOTE — Anesthesia Post-op Follow-up Note (Signed)
Anesthesia QCDR form completed.        

## 2018-12-07 NOTE — Op Note (Addendum)
[facility] Patient Name: Linzi Darbonne Procedure Date: 12/07/2018 10:08 AM MRN: [id number] Account #: [id number] Date of Birth: May 16, 1970 Admit Type: Outpatient Age: 48 Room: [facility] ENDO ROOM 4 Gender: Female Note Status: Finalized Procedure:            Colonoscopy Indications:          Positive Cologuard test Providers:            Jonathon Bellows MD, MD Medicines:            Monitored Anesthesia Care Complications:        No immediate complications. Procedure:            Pre-Anesthesia Assessment:                       - Prior to the procedure, a History and Physical was                        performed, and patient medications, allergies and                        sensitivities were reviewed. The patient's tolerance of                        previous anesthesia was reviewed.                       - The risks and benefits of the procedure and the                        sedation options and risks were discussed with the                        patient. All questions were answered and informed                        consent was obtained.                       - ASA Grade Assessment: II - A patient with mild                        systemic disease.                       After obtaining informed consent, the colonoscope was                        passed under direct vision. Throughout the procedure,                        the patient's blood pressure, pulse, and oxygen                        saturations were monitored continuously. The                        Colonoscope was introduced through the anus and                        advanced to the the cecum, identified by the  appendiceal orifice. The colonoscopy was performed with                        ease. The patient tolerated the procedure well. Findings:      The perianal and digital rectal examinations were normal.      Two sessile polyps were found in the transverse colon and  ascending       colon. The polyps were 4 to 6 mm in size. These polyps were removed with       a cold snare. Resection and retrieval were complete.      A 10 mm polyp was found in the ascending colon. The polyp was sessile.       The polyp was removed with a hot snare. Resection and retrieval were       complete.      A fungating non-obstructing large mass was found in the cecum. The mass       was partially circumferential (involving one-half of the lumen       circumference). The mass measured two cm in length. In addition, its       diameter measured three mm. No bleeding was present. This was biopsied       with a cold forceps for histology.      A 20 mm polyp was found in the proximal ascending colon. The polyp was       sessile. very close to the IC valve not resected since she would need       surgery to resct the cecal mass Area was successfully injected with 5 mL       Spot (carbon black) for tattooing. Tatoo placed proximal to the polyp       again very close to the cecal mass.      A polypoid non-obstructing large mass was found in the rectum. The mass       was non-circumferential. The mass measured three cm in length. In       addition, its diameter measured thirty mm. No bleeding was present. no       biopsies taken to prevent scar tissue as it may be resectable by EMR      The exam was otherwise without abnormality on direct and retroflexion       views. Impression:           - Two 4 to 6 mm polyps in the transverse colon and in                        the ascending colon, removed with a cold snare.                        Resected and retrieved.                       - One 10 mm polyp in the ascending colon, removed with                        a hot snare. Resected and retrieved.                       - Rule out malignancy, tumor in the cecum. Biopsied.                       - One  20 mm polyp in the proximal ascending colon.                        Injected.                        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                       - The examination was otherwise normal on direct and                        retroflexion views. Recommendation:       - Discharge patient to home (with escort).                       - Resume previous diet.                       - Continue present medications.                       - Await pathology results.                       - Return to my office in 1 week.                       - 1. likely needs EMR at [facility] cone for large rectal                        polyp and surgery for the cecal mass based on biopsy                        results Procedure Code(s):    --- Professional ---                       [phone], Colonoscopy, flexible; with removal of tumor(s),                        polyp(s), or other lesion(s) by snare technique                       45380, 59, Colonoscopy, flexible; with biopsy, single                        or multiple                       45381, Colonoscopy, flexible; with directed submucosal                        injection(s), any substance Diagnosis Code(s):    --- Professional ---                       D49.0, Neoplasm of unspecified behavior of digestive                        system                       K63.5, Polyp of colon  R19.5, Other fecal abnormalities CPT copyright 2019 American Medical Association. All rights reserved. The codes documented in this report are preliminary and upon coder review may  be revised to meet current compliance requirements. Jonathon Bellows, MD Jonathon Bellows MD, MD 12/07/2018 10:51:08 AM This report has been signed electronically. Number of Addenda: 0 Note Initiated On: 12/07/2018 10:08 AM Scope Withdrawal Time: 0 hours 16 minutes 46 seconds  Total Procedure Duration: 0 hours 20 minutes 12 seconds  Estimated Blood Loss: Estimated blood loss: none.      [facility]

## 2018-12-07 NOTE — Transfer of Care (Signed)
Immediate Anesthesia Transfer of Care Note  Patient: Tracy Booth  Procedure(s) Performed: COLONOSCOPY WITH PROPOFOL (N/A )  Patient Location: PACU  Anesthesia Type:General  Level of Consciousness: sedated  Airway & Oxygen Therapy: Patient Spontanous Breathing and Patient connected to nasal cannula oxygen  Post-op Assessment: Report given to RN and Post -op Vital signs reviewed and stable  Post vital signs: Reviewed and stable  Last Vitals:  Vitals Value Taken Time  BP    Temp    Pulse 79 12/07/18 1047  Resp 28 12/07/18 1047  SpO2 100 % 12/07/18 1047  Vitals shown include unvalidated device data.  Last Pain:  Vitals:   12/07/18 1040  TempSrc: Tympanic  PainSc:          Complications: No apparent anesthesia complications

## 2018-12-07 NOTE — Anesthesia Postprocedure Evaluation (Signed)
Anesthesia Post Note  Patient: Tracy Booth  Procedure(s) Performed: COLONOSCOPY WITH PROPOFOL (N/A )  Patient location during evaluation: PACU Anesthesia Type: General Level of consciousness: awake and alert Pain management: pain level controlled Vital Signs Assessment: post-procedure vital signs reviewed and stable Respiratory status: spontaneous breathing, nonlabored ventilation and respiratory function stable Cardiovascular status: blood pressure returned to baseline and stable Postop Assessment: no apparent nausea or vomiting Anesthetic complications: no     Last Vitals:  Vitals:   12/07/18 1110 12/07/18 1120  BP: (!) 152/102 (!) 166/86  Pulse: 67   Resp: 18 (!) 24  Temp:    SpO2: 100% 100%    Last Pain:  Vitals:   12/07/18 1040  TempSrc: Tympanic  PainSc:                  Durenda Hurt

## 2018-12-07 NOTE — Anesthesia Preprocedure Evaluation (Signed)
Anesthesia Evaluation  Patient identified by MRN, date of birth, ID band Patient awake    Reviewed: Allergy & Precautions, H&P , NPO status , Patient's Chart, lab work & pertinent test results  History of Anesthesia Complications (+) PONV and history of anesthetic complications  Airway Mallampati: II  TM Distance: >3 FB Neck ROM: full    Dental  (+) Teeth Intact   Pulmonary neg pulmonary ROS, neg shortness of breath, neg COPD, neg recent URI,           Cardiovascular (-) angina(-) Past MI and (-) Cardiac Stents negative cardio ROS  (-) dysrhythmias      Neuro/Psych PSYCHIATRIC DISORDERS Depression negative neurological ROS     GI/Hepatic Neg liver ROS, GERD  Controlled,  Endo/Other  negative endocrine ROS  Renal/GU negative Renal ROS  negative genitourinary   Musculoskeletal   Abdominal   Peds  Hematology negative hematology ROS (+)   Anesthesia Other Findings Obesity  Past Medical History: No date: Complication of anesthesia No date: Dizziness     Comment:  occas No date: Heart murmur No date: History of infertility No date: Obesity No date: PONV (postoperative nausea and vomiting) No date: Seasonal allergies No date: Spontaneous pneumothorax  Past Surgical History: 2012: ABLATION ON ENDOMETRIOSIS 1997 and 1999: LUNG SURGERY; Left 1975: TONSILLECTOMY 08/31/2017: ULNAR COLLATERAL LIGAMENT REPAIR; Left     Comment:  Procedure: ULNAR COLLATERAL LIGAMENT REPAIR- LEFT THUMB;              Surgeon: Corky Mull, MD;  Location: [facility] ORS;                Service: Orthopedics;  Laterality: Left;  BMI    Body Mass Index: 35.19 kg/m      Reproductive/Obstetrics negative OB ROS                             Anesthesia Physical Anesthesia Plan  ASA: II  Anesthesia Plan: General   Post-op Pain Management:    Induction:   PONV Risk Score and Plan: Propofol infusion and  TIVA  Airway Management Planned: Nasal Cannula  Additional Equipment:   Intra-op Plan:   Post-operative Plan:   Informed Consent: I have reviewed the patients History and Physical, chart, labs and discussed the procedure including the risks, benefits and alternatives for the proposed anesthesia with the patient or authorized representative who has indicated his/her understanding and acceptance.     Dental Advisory Given  Plan Discussed with: Anesthesiologist and CRNA  Anesthesia Plan Comments:         Anesthesia Quick Evaluation

## 2018-12-07 NOTE — Anesthesia Procedure Notes (Signed)
Date/Time: 12/07/2018 10:24 AM Performed by: Nelda Marseille, CRNA Pre-anesthesia Checklist: Patient identified, Emergency Drugs available, Suction available, Patient being monitored and Timeout performed Oxygen Delivery Method: Nasal cannula

## 2018-12-10 ENCOUNTER — Encounter: Payer: Self-pay | Admitting: Gastroenterology

## 2018-12-11 LAB — SURGICAL PATHOLOGY

## 2018-12-11 NOTE — Progress Notes (Signed)
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 Sessile polyp in the rectum was pretty large- I have taken multiple pictures- can you see if its something you can take out ?  Bailey Mech

## 2018-12-11 NOTE — Progress Notes (Signed)
Tracy Booth  Inform - no confirmed cancer seen in the biopsy but I need to see her soon to discuss next steps  C/c Mar Daring, PA-C   Dr Jonathon Bellows MD,MRCP [facility]) Gastroenterology/Hepatology Pager: [phone]

## 2018-12-12 ENCOUNTER — Other Ambulatory Visit: Payer: Self-pay

## 2018-12-12 ENCOUNTER — Telehealth: Payer: Self-pay | Admitting: Gastroenterology

## 2018-12-12 ENCOUNTER — Ambulatory Visit (INDEPENDENT_AMBULATORY_CARE_PROVIDER_SITE_OTHER): Payer: BC Managed Care – PPO | Admitting: Gastroenterology

## 2018-12-12 VITALS — BP 171/89 | HR 84 | Temp 97.9°F | Ht 64.0 in | Wt 219.2 lb

## 2018-12-12 DIAGNOSIS — D490 Neoplasm of unspecified behavior of digestive system: Secondary | ICD-10-CM

## 2018-12-12 DIAGNOSIS — D126 Benign neoplasm of colon, unspecified: Secondary | ICD-10-CM | POA: Diagnosis not present

## 2018-12-12 DIAGNOSIS — R195 Other fecal abnormalities: Secondary | ICD-10-CM

## 2018-12-12 NOTE — Progress Notes (Signed)
Jonathon Bellows MD, MRCP(U.K) [address]  [place], [place] [address]  Main: [phone]  Fax: [phone]   Gastroenterology Consultation  Referring Provider:     Florian Buff* Primary Care Physician:  Mar Daring, PA-C Primary Gastroenterologist:  Dr. Jonathon Bellows  Reason for Consultation:     Discuss results of recent colonoscopy        HPI:   Tracy Booth is a 48 y.o. y/o female underwent a colonoscopy on 12/07/2018 by myself for a positive Cologuard test.  On the colonoscopy she had 2 diminutive polyps in the transverse and ascending colon which were 4 to 6 mm in size and were resected with a cold snare.At the cecum there was a large mass that was partially circumferential involving probably about half of the circumference lumen.  Diameter probably 2 to 3 cm at least and extended for about 2 to 3 cm.  It was on a very broad base and I could see a degree of ulceration towards the base which was very concerning.  Multiple biopsies were taken which showed tubulovillous adenoma with focal high-grade dysplasia.  I did not feel that this polyp was amenable for endoscopic resection and hence tattooed the distal aspect of this mass.  Very close to this mass there was also a flat sessile polyp on the opposite wall of this mass in the very proximal part of the ascending colon.  I did not take any biopsies nor attempt to resect this lesion which appeared to be a sessile serrated adenoma since I felt this patient is going to go for surgery and it could be resected at the same time.  That tattoo placed was also distal to this sessile serrated adenoma appearing polyp.    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  I felt this lesion is probably amenable to EMR.  In addition she had smaller polyps which  were adenomatous and tubulovillous adenomas.  I have informed her to come in today to discuss these findings. Past Medical History:  Diagnosis Date  . Complication of anesthesia   . Dizziness    occas  . Heart murmur   . History of infertility   . Obesity   . PONV (postoperative nausea and vomiting)   . Seasonal allergies   . Spontaneous pneumothorax     Past Surgical History:  Procedure Laterality Date  . ABLATION ON ENDOMETRIOSIS  2012  . COLONOSCOPY WITH PROPOFOL N/A 12/07/2018   Procedure: COLONOSCOPY WITH PROPOFOL;  Surgeon: Jonathon Bellows, MD;  Location: [facility] ENDOSCOPY;  Service: Gastroenterology;  Laterality: N/A;  . LUNG SURGERY Left 1997 and 1999  . TONSILLECTOMY  1975  . ULNAR COLLATERAL LIGAMENT REPAIR Left 08/31/2017   Procedure: ULNAR COLLATERAL LIGAMENT REPAIR- LEFT THUMB;  Surgeon: Corky Mull, MD;  Location: [facility] ORS;  Service: Orthopedics;  Laterality: Left;    Prior to Admission medications   Medication Sig Start Date End Date Taking? Authorizing Provider  acetaminophen (TYLENOL) 500 MG tablet Take 500 mg by mouth daily as needed for moderate pain or headache.    [provider]  cetirizine (ZYRTEC) 10 MG tablet Take 10 mg by mouth daily as needed for allergies.    [provider]  escitalopram (LEXAPRO) 10 MG tablet TAKE 1 TABLET AT BEDTIME 05/25/18  Fenton Malling M, PA-C  famotidine (PEPCID AC) 10 MG tablet Take 10 mg by mouth daily as needed for heartburn or indigestion.    [provider]  norethindrone (AYGESTIN) 5 MG tablet Take 1 tablet (5 mg total) by mouth at bedtime. 10/24/18   Mar Daring, PA-C  Probiotic CAPS Take 2 capsules by mouth at bedtime.    [provider]    Family History  Problem Relation Age of Onset  . Hypertension Mother   . Arthritis Mother   . Fibromyalgia Mother   . Breast cancer Maternal Grandmother      Social History   Tobacco Use  . Smoking status: Never Smoker  . Smokeless  tobacco: Never Used  Substance Use Topics  . Alcohol use: Yes    Comment: rare  . Drug use: Never    Allergies as of 12/12/2018  . (Not on File)    Review of Systems:    All systems reviewed and negative except where noted in HPI.   Physical Exam:  There were no vitals taken for this visit. No LMP recorded. Patient has had an ablation. Psych:  Alert and cooperative. Normal mood and affect. General:   Alert,  Well-developed, well-nourished, pleasant and cooperative in NAD Head:  Normocephalic and atraumatic. Eyes:  Sclera clear, no icterus.   Conjunctiva pink. Ears:  Normal auditory acuity. Nose:  No deformity, discharge, or lesions. Mouth:  No deformity or lesions,oropharynx pink & moist. Neck:  Supple; no masses or thyromegaly. Lungs:  Respirations even and unlabored.  Clear throughout to auscultation.   No wheezes, crackles, or rhonchi. No acute distress. Heart:  Regular rate and rhythm; no murmurs, clicks, rubs, or gallops. Abdomen:  Normal bowel sounds.  No bruits.  Soft, non-tender and non-distended without masses, hepatosplenomegaly or hernias noted.  No guarding or rebound tenderness.    Neurologic:  Alert and oriented x3;  grossly normal neurologically. Skin:  Intact without significant lesions or rashes. No jaundice. Lymph Nodes:  No significant cervical adenopathy. Psych:  Alert and cooperative. Normal mood and affect.  Imaging Studies: No results found.  Assessment and Plan:   TIAA KIAH is a 48 y.o. y/o female is here today to discuss the results of her recent colonoscopy.  It was performed for a positive Cologuard test.  On her colonoscopy there was a large mass seen in the cecum biopsies of which have demonstrated tubulovillous adenoma with focal high-grade dysplasia.  No invasive carcinoma identified.  I did note area of ulceration in the polyp my biopsies were taken at this site, this area appears a bit concerning.  On the opposite wall of the colon just  at the border of the cecum and the ascending colon was also a flat lesion probably a sessile serrated adenoma.  I did not attempt to resect it.  Reason being that since she needs surgery this would be included at the same time.  A second large lesion was noted in the rectum which I feel is amenable to endoscopic mucosal resection.  I am going to refer and discuss with Dr. Rush Landmark to see if he feels that this lesion in the rectum is amenable to endoscopic mucosal resection  If amenable for endoscopic mucosal resection she would require only surgery for the lesion in the cecum and ascending colon.  If not amenable to endoscopic mucosal resection then she would probably require surgical resection at 2 sites..  I will also refer to a colorectal surgeon: She says  that she knows somebody personally and will get back to me in a day or 2 with regards to his name.  Reason for referral would be to  discuss surgical options for the lesion in the cecum and the lesion in the rectum if EMR fails.    Follow up in 2 weeks telephone visit  Dr Jonathon Bellows MD,MRCP(U.K)

## 2018-12-12 NOTE — Telephone Encounter (Signed)
Pt left vm returning Dr. Georgeann Oppenheim  nurse call

## 2018-12-12 NOTE — Telephone Encounter (Signed)
Spoke with pt and informed her of CT scan appointment information.

## 2018-12-14 ENCOUNTER — Other Ambulatory Visit: Payer: Self-pay

## 2018-12-14 ENCOUNTER — Telehealth: Payer: Self-pay | Admitting: Gastroenterology

## 2018-12-14 DIAGNOSIS — D126 Benign neoplasm of colon, unspecified: Secondary | ICD-10-CM

## 2018-12-14 MED ORDER — PEG 3350-KCL-NA BICARB-NACL 420 G PO SOLR: 4000.0000 mL | Freq: Once | ORAL | 0 refills | Status: AC

## 2018-12-14 NOTE — Telephone Encounter (Signed)
Pt is calling to check on Referral to Surgeon Dr. Maude Leriche in [facility] she has not heard anything from his office please call pt

## 2018-12-17 ENCOUNTER — Other Ambulatory Visit: Payer: Self-pay

## 2018-12-17 ENCOUNTER — Telehealth: Payer: Self-pay | Admitting: Gastroenterology

## 2018-12-17 NOTE — Telephone Encounter (Signed)
Pt left vm for Dr. Georgeann Oppenheim nurse to call her back

## 2018-12-17 NOTE — Telephone Encounter (Signed)
Spoke with Tracy Booth and informed her that I spoke with [facility] to check on the status of the referral and was informed that the referral was received and will be reviewed and Tracy Booth will be contacted to schedule within the next 48 to 72 hours. I also informed Tracy Booth of Dr. Georgeann Oppenheim suggestion to refer Tracy Booth to [facility] for a sooner appt as he'd like Tracy Booth to see the surgeon prior to her procedure 01-23-19 with Dr. Rush Landmark. Tracy Booth stated she like to see if her preferred surgeon at [facility], Dr. Harlon Ditty, is abble to see her prior to her procedure and if not she'd like to proceed with the referral to [facility].

## 2018-12-18 ENCOUNTER — Telehealth: Payer: Self-pay | Admitting: Gastroenterology

## 2018-12-18 NOTE — Telephone Encounter (Signed)
Left message for Katrina with [facility]ervice [facility] to return my call.

## 2018-12-18 NOTE — Telephone Encounter (Signed)
So Dr Rush Landmark it mentions she will be going to [facility].  If that is the case will she still need the appointments with our office?

## 2018-12-18 NOTE — Telephone Encounter (Signed)
Tracy Booth  From the pre service center left vm to obtain authorization on pt CT scan for Friday please call [phone] ext [phone]

## 2018-12-18 NOTE — Telephone Encounter (Signed)
The pt has been scheduled to speak with Dr Jerilynn Mages prior to her appt for procedure.

## 2018-12-18 NOTE — Telephone Encounter (Signed)
Patty, it looks like it is GI surgery but not GI Endoscopy is what is scheduled for now in her CareEverywhere notation. For now, would still plan to keep our appointment as well as set up the visit to discuss Advanced Polyp resection. But if she decides that she would want all of her care at [facility], then she can let us know. Thanks. GM

## 2018-12-18 NOTE — Telephone Encounter (Signed)
I think this plan sounds reasonable.  I will CC this message also to Dr. Rush Landmark.  Dr Jonathon Bellows MD,MRCP [facility]) Gastroenterology/Hepatology Pager: [phone]

## 2018-12-18 NOTE — Telephone Encounter (Signed)
Dear All, I am OK with this plan. I will evaluate and do my best to get that rectal lesion taken care of. She should have a clinic appointment with me to discuss things further as well, and I believe Chong Sicilian will be working on that. I am OK for telemedicine vs inperson whichever is patient's preference to go over EMR techniques and plan. Patty, any time in the next few weeks is OK and OK for overbook at end of day if necessary. Thanks to all.  GM

## 2018-12-19 ENCOUNTER — Telehealth: Payer: Self-pay | Admitting: Gastroenterology

## 2018-12-19 NOTE — Telephone Encounter (Signed)
Pt left vm for Tracy Booth  She was waiting on Directions from her please call pt

## 2018-12-19 NOTE — Telephone Encounter (Signed)
Spoke with pt and informed her that Dr. Vicente Males as well as Dr. Rush Landmark both agree with [facility] surgery's plan. (see 12-17-18 telephone encounter). Pt understands and agrees.

## 2018-12-21 ENCOUNTER — Other Ambulatory Visit: Payer: Self-pay

## 2018-12-21 ENCOUNTER — Ambulatory Visit
Admission: RE | Admit: 2018-12-21 | Discharge: 2018-12-21 | Disposition: A | Discharge status: Home / Self Care | Payer: BC Managed Care – PPO | Source: Ambulatory Visit | Attending: Gastroenterology | Admitting: Gastroenterology

## 2018-12-21 DIAGNOSIS — D490 Neoplasm of unspecified behavior of digestive system: Secondary | ICD-10-CM | POA: Insufficient documentation

## 2018-12-21 DIAGNOSIS — K6389 Other specified diseases of intestine: Secondary | ICD-10-CM | POA: Diagnosis not present

## 2018-12-21 DIAGNOSIS — R195 Other fecal abnormalities: Secondary | ICD-10-CM | POA: Insufficient documentation

## 2018-12-21 MED ORDER — IOHEXOL 300 MG/ML  SOLN
100.0000 mL | Freq: Once | INTRAMUSCULAR | Status: AC | PRN
  Administered 2018-12-21: 100 mL via INTRAVENOUS

## 2018-12-25 ENCOUNTER — Encounter: Payer: Self-pay | Admitting: Gastroenterology

## 2018-12-26 ENCOUNTER — Telehealth: Payer: Self-pay

## 2018-12-26 ENCOUNTER — Telehealth: Payer: Self-pay | Admitting: Gastroenterology

## 2018-12-26 NOTE — Telephone Encounter (Signed)
-----   Message from Jonathon Bellows, MD sent at 12/25/2018 10:01 AM EDT ----- Sherald Hess please inform - CT shows no abnormality outside the colon - only lesion seen is at the cecum which we are already aware of .   C/c Mar Daring, PA-C , Dr Rush Landmark

## 2018-12-26 NOTE — Telephone Encounter (Signed)
Spoke with pt and informed her of CT scan results. 

## 2018-12-26 NOTE — Telephone Encounter (Signed)
Pt left vm to check on her CT scan results

## 2019-01-04 DIAGNOSIS — K635 Polyp of colon: Secondary | ICD-10-CM | POA: Diagnosis not present

## 2019-01-04 DIAGNOSIS — K621 Rectal polyp: Secondary | ICD-10-CM | POA: Diagnosis not present

## 2019-01-04 DIAGNOSIS — Z6837 Body mass index (BMI) 37.0-37.9, adult: Secondary | ICD-10-CM | POA: Diagnosis not present

## 2019-01-11 ENCOUNTER — Telehealth: Payer: Self-pay | Admitting: Gastroenterology

## 2019-01-11 NOTE — Telephone Encounter (Signed)
Cont..  Schedule appt for 01-23-19 at hospital needing to cancel  not 02-23-19

## 2019-01-11 NOTE — Telephone Encounter (Signed)
Procedure has been cancelled per pt request she will have case done elsewhere.  [facility] has been advised.

## 2019-01-16 ENCOUNTER — Telehealth: Payer: Self-pay

## 2019-01-16 NOTE — Telephone Encounter (Signed)
Pt called to inform Dr. Vicente Males that she has decided to have both the endoscopy procedure and GI surgery performed at [facility] as recommended by Dr. Harlon Ditty, pt's GI surgeon at [facility]. Pt states she's scheduled to have the endoscopy on 01-30-19 and the surgery on 02-18-19. Pt states she does have some concerns about Dr. Erline Levine plan to remove a larger portion of pt's colon rather than just the affected area. Pt is request Dr. Georgeann Oppenheim opinion on Dr. Erline Levine plan. I explained that I will relay this information to Dr. Vicente Males.

## 2019-01-17 NOTE — Telephone Encounter (Signed)
The note is in epic under 01/04/2019 office note with General surgeon Dr. Launa Flight

## 2019-01-17 NOTE — Telephone Encounter (Signed)
It mentions plan for colonoscopy - then I see colonoscopy cancelled and proceed with surgery - does  Not mention anything about what kind of surgery

## 2019-01-17 NOTE — Telephone Encounter (Signed)
Tracy Booth I cannot see any note with the surgical plan . I do see she is scheduled for surgery but cant see anywhere on the type. Can you look into it?

## 2019-01-18 ENCOUNTER — Ambulatory Visit: Payer: BC Managed Care – PPO | Admitting: Gastroenterology

## 2019-01-19 ENCOUNTER — Other Ambulatory Visit (HOSPITAL_COMMUNITY): Admission: RE | Admit: 2019-01-19 | Payer: BC Managed Care – PPO | Source: Ambulatory Visit

## 2019-01-23 ENCOUNTER — Ambulatory Visit (HOSPITAL_COMMUNITY): Admit: 2019-01-23 | Payer: BC Managed Care – PPO | Admitting: Gastroenterology

## 2019-01-23 ENCOUNTER — Encounter (HOSPITAL_COMMUNITY): Payer: Self-pay

## 2019-01-23 SURGERY — COLONOSCOPY WITH PROPOFOL
Anesthesia: Monitor Anesthesia Care

## 2019-01-24 DIAGNOSIS — K635 Polyp of colon: Secondary | ICD-10-CM | POA: Diagnosis not present

## 2019-01-27 DIAGNOSIS — Z01818 Encounter for other preprocedural examination: Secondary | ICD-10-CM | POA: Diagnosis not present

## 2019-01-30 DIAGNOSIS — D123 Benign neoplasm of transverse colon: Secondary | ICD-10-CM | POA: Diagnosis not present

## 2019-01-30 DIAGNOSIS — K621 Rectal polyp: Secondary | ICD-10-CM | POA: Diagnosis not present

## 2019-01-30 DIAGNOSIS — E669 Obesity, unspecified: Secondary | ICD-10-CM | POA: Diagnosis not present

## 2019-01-30 DIAGNOSIS — D120 Benign neoplasm of cecum: Secondary | ICD-10-CM | POA: Diagnosis not present

## 2019-01-30 DIAGNOSIS — D128 Benign neoplasm of rectum: Secondary | ICD-10-CM | POA: Diagnosis not present

## 2019-01-30 DIAGNOSIS — Z79899 Other long term (current) drug therapy: Secondary | ICD-10-CM | POA: Diagnosis not present

## 2019-01-30 DIAGNOSIS — D122 Benign neoplasm of ascending colon: Secondary | ICD-10-CM | POA: Diagnosis not present

## 2019-01-30 LAB — HM COLONOSCOPY

## 2019-02-05 ENCOUNTER — Encounter: Payer: Self-pay | Admitting: Physician Assistant

## 2019-05-15 ENCOUNTER — Other Ambulatory Visit: Payer: Self-pay | Admitting: Physician Assistant

## 2019-05-15 DIAGNOSIS — Z1231 Encounter for screening mammogram for malignant neoplasm of breast: Secondary | ICD-10-CM

## 2019-06-14 ENCOUNTER — Ambulatory Visit
Admission: RE | Admit: 2019-06-14 | Discharge: 2019-06-14 | Disposition: A | Discharge status: Home / Self Care | Payer: BC Managed Care – PPO | Source: Ambulatory Visit | Attending: Physician Assistant | Admitting: Physician Assistant

## 2019-06-14 DIAGNOSIS — Z1231 Encounter for screening mammogram for malignant neoplasm of breast: Secondary | ICD-10-CM | POA: Insufficient documentation

## 2019-06-17 ENCOUNTER — Telehealth: Payer: Self-pay

## 2019-06-17 NOTE — Telephone Encounter (Signed)
-----   Message from Mar Daring, [place] sent at 06/14/2019  6:27 PM EST ----- Normal mammogram. Repeat screening in one year.

## 2019-06-17 NOTE — Telephone Encounter (Signed)
Pt advised.   Thanks,   -Kiowa Peifer  

## 2019-07-30 DIAGNOSIS — Z20828 Contact with and (suspected) exposure to other viral communicable diseases: Secondary | ICD-10-CM | POA: Diagnosis not present

## 2019-07-30 DIAGNOSIS — Z03818 Encounter for observation for suspected exposure to other biological agents ruled out: Secondary | ICD-10-CM | POA: Diagnosis not present

## 2019-08-02 DIAGNOSIS — Z86010 Personal history of colonic polyps: Secondary | ICD-10-CM | POA: Diagnosis not present

## 2019-08-02 DIAGNOSIS — E669 Obesity, unspecified: Secondary | ICD-10-CM | POA: Diagnosis not present

## 2019-08-02 DIAGNOSIS — D126 Benign neoplasm of colon, unspecified: Secondary | ICD-10-CM | POA: Diagnosis not present

## 2019-08-02 DIAGNOSIS — Z6835 Body mass index (BMI) 35.0-35.9, adult: Secondary | ICD-10-CM | POA: Diagnosis not present

## 2019-08-02 DIAGNOSIS — Z1211 Encounter for screening for malignant neoplasm of colon: Secondary | ICD-10-CM | POA: Diagnosis not present

## 2019-08-02 DIAGNOSIS — D122 Benign neoplasm of ascending colon: Secondary | ICD-10-CM | POA: Diagnosis not present

## 2019-08-03 ENCOUNTER — Ambulatory Visit: Payer: BC Managed Care – PPO | Attending: Physician Assistant

## 2019-08-03 DIAGNOSIS — Z23 Encounter for immunization: Secondary | ICD-10-CM

## 2019-08-03 NOTE — Progress Notes (Signed)
   Covid-19 Vaccination Clinic  Name:  Tracy Booth    MRN: [id number] DOB: May 20, 1970  08/03/2019  [name] was observed post Covid-19 immunization for 15 minutes without incident. She was provided with Vaccine Information Sheet and instruction to access the V-Safe system.   [name] was instructed to call 911 with any severe reactions post vaccine: Marland Kitchen Difficulty breathing  . Swelling of face and throat  . A fast heartbeat  . A bad rash all over body  . Dizziness and weakness   Immunizations Administered    Name Date Dose VIS Date Route   Pfizer COVID-19 Vaccine 08/03/2019 10:08 AM 0.3 mL 03/29/2019 Intramuscular   Manufacturer: [employer], [employer]   Lot: [id number]   [place]: [id number]

## 2019-08-04 ENCOUNTER — Other Ambulatory Visit: Payer: Self-pay | Admitting: Physician Assistant

## 2019-08-04 DIAGNOSIS — F3341 Major depressive disorder, recurrent, in partial remission: Secondary | ICD-10-CM

## 2019-08-04 NOTE — Telephone Encounter (Signed)
Called pt to make f/u appt for medication refill. LM on VM with call back number. Pt > 3 months overdue for appt. Requested Prescriptions  Pending Prescriptions Disp Refills  . escitalopram (LEXAPRO) 10 MG tablet [Pharmacy Med Name: ESCITALOPRAM TABS 10MG ] 90 tablet 3    Sig: TAKE 1 TABLET AT BEDTIME     Psychiatry:  Antidepressants - SSRI Failed - 08/04/2019  3:37 PM      Failed - Valid encounter within last 6 months    Recent Outpatient Visits          11 months ago Annual physical exam   [facility] [place], Clearnce Sorrel, [place]   2 years ago Annual physical exam   [facility] Fenton Malling M, [place]   3 years ago Recurrent major depressive disorder, in partial remission [facility])   [place], Clearnce Sorrel, [place]   3 years ago Establishing care with new doctor, encounter for   [place], [place]             Passed - Completed PHQ-2 or PHQ-9 in the last 360 days.

## 2019-08-16 ENCOUNTER — Encounter: Payer: Self-pay | Admitting: Physician Assistant

## 2019-08-16 ENCOUNTER — Ambulatory Visit (INDEPENDENT_AMBULATORY_CARE_PROVIDER_SITE_OTHER): Payer: BC Managed Care – PPO | Admitting: Physician Assistant

## 2019-08-16 ENCOUNTER — Other Ambulatory Visit: Payer: Self-pay

## 2019-08-16 VITALS — BP 162/97 | HR 71 | Temp 97.6°F | Resp 16 | Wt 221.6 lb

## 2019-08-16 DIAGNOSIS — I10 Essential (primary) hypertension: Secondary | ICD-10-CM

## 2019-08-16 DIAGNOSIS — N951 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: Secondary | ICD-10-CM | POA: Diagnosis not present

## 2019-08-16 DIAGNOSIS — F3341 Major depressive disorder, recurrent, in partial remission: Secondary | ICD-10-CM

## 2019-08-16 DIAGNOSIS — Z6838 Body mass index (BMI) 38.0-38.9, adult: Secondary | ICD-10-CM

## 2019-08-16 MED ORDER — NORETHINDRONE ACETATE 5 MG PO TABS: 5.0000 mg | ORAL_TABLET | Freq: Every day | ORAL | 3 refills | Status: DC

## 2019-08-16 MED ORDER — TRIAMTERENE-HCTZ 37.5-25 MG PO TABS: 1.0000 | ORAL_TABLET | Freq: Every day | ORAL | 3 refills | Status: DC

## 2019-08-16 NOTE — Progress Notes (Signed)
Established patient visit   Patient: Tracy Booth   DOB: 1970/07/01   49 y.o. Female  MRN: [id number] Visit Date: 08/16/2019  Today's healthcare provider: Mar Daring, PA-C   Chief Complaint  Patient presents with  . Depression  . Hypertension   Subjective    HPI Follow up for Depression  The patient was last seen for this 1 years ago. Changes made at last visit include none. Current treatment Lexapro 10mg .  She reports excellent compliance with treatment. She feels that condition is Improved. She is not having side effects.   ----------------------------------------------------------------------------------------- Hypertension: Patient is here for evaluation of elevated blood pressures.  Age at onset of elevated blood pressure: 49 year old.Reports that since last year she started to notice that her blood pressure has been running high.She has also gain weight feels that this has a lot to do with it.Cardiac symptoms Palpitations in the last year at least 3 times. Not regularly. Patient denies chest pain, chest pressure/discomfort, claudication, dyspnea, exertional chest pressure/discomfort, fatigue, irregular heart beat, lower extremity edema, near-syncope, orthopnea and syncope.  Cardiovascular risk factors: family history of premature cardiovascular disease and obesity (BMI >= 30 kg/m2).she is not on any medication for high blood pressure this is new.   BP Readings from Last 3 Encounters:  08/16/19 (!) 162/97  12/12/18 (!) 171/89  12/07/18 (!) 166/86   Wt Readings from Last 3 Encounters:  08/16/19 221 lb 9.6 oz (100.5 kg)  12/12/18 219 lb 3.2 oz (99.4 kg)  12/07/18 205 lb (93 kg)    Patient Active Problem List   Diagnosis Date Noted  . Recurrent major depressive disorder, in partial remission ([place]) 08/24/2018  . Vertigo 11/25/2013  . Esophageal reflux 11/25/2013  . Metrorrhagia 08/18/2010   Past Medical History:  Diagnosis Date  . Complication of  anesthesia   . Dizziness    occas  . Heart murmur   . History of infertility   . Obesity   . PONV (postoperative nausea and vomiting)   . Seasonal allergies   . Spontaneous pneumothorax    No Known Allergies     Medications: Outpatient Medications Prior to Visit  Medication Sig  . acetaminophen (TYLENOL) 500 MG tablet Take 500 mg by mouth daily as needed for moderate pain or headache.  . cetirizine (ZYRTEC) 10 MG tablet Take 10 mg by mouth daily as needed for allergies.  Marland Kitchen escitalopram (LEXAPRO) 10 MG tablet TAKE 1 TABLET AT BEDTIME  . famotidine (PEPCID AC) 10 MG tablet Take 10 mg by mouth daily as needed for heartburn or indigestion.  . norethindrone (AYGESTIN) 5 MG tablet Take 1 tablet (5 mg total) by mouth at bedtime.  . Probiotic CAPS Take 2 capsules by mouth at bedtime.   No facility-administered medications prior to visit.    Review of Systems  Constitutional: Positive for fatigue.  HENT: Negative.   Respiratory: Negative.   Cardiovascular: Positive for palpitations (occasionally).  Musculoskeletal: Negative.   Neurological: Positive for headaches. Negative for dizziness, tremors, weakness and numbness.  Psychiatric/Behavioral: Positive for agitation, decreased concentration, dysphoric mood and sleep disturbance.    Last CBC Lab Results  Component Value Date   WBC 6.4 08/24/2018   HGB 14.1 08/24/2018   HCT 39.8 08/24/2018   MCV 88 08/24/2018   MCH 31.1 08/24/2018   RDW 12.3 08/24/2018   PLT 359 [id number]   Last metabolic panel Lab Results  Component Value Date   GLUCOSE 80 08/24/2018   NA 142  08/24/2018   K 4.4 08/24/2018   CL 107 (H) 08/24/2018   CO2 20 08/24/2018   BUN 8 08/24/2018   CREATININE 0.98 08/24/2018   GFRNONAA 67 08/24/2018   GFRAA 78 08/24/2018   CALCIUM 8.5 (L) 08/24/2018   PROT 6.4 08/24/2018   ALBUMIN 4.1 08/24/2018   LABGLOB 2.3 08/24/2018   AGRATIO 1.8 08/24/2018   BILITOT 0.3 08/24/2018   ALKPHOS 74 08/24/2018   AST 18  08/24/2018   ALT 14 08/24/2018   Last lipids Lab Results  Component Value Date   CHOL 210 (H) 08/24/2018   HDL 43 08/24/2018   LDLCALC 150 (H) 08/24/2018   TRIG 87 08/24/2018   CHOLHDL 4.9 (H) 08/24/2018   Last hemoglobin A1c Lab Results  Component Value Date   HGBA1C 5.3 08/24/2018      Objective    BP (!) 162/97 (BP Location: Left Arm, Patient Position: Sitting, Cuff Size: Large)   Pulse 71   Temp 97.6 F (36.4 C) (Temporal)   Resp 16   Wt 221 lb 9.6 oz (100.5 kg)   BMI 38.04 kg/m  BP Readings from Last 3 Encounters:  08/16/19 (!) 162/97  12/12/18 (!) 171/89  12/07/18 (!) 166/86   Wt Readings from Last 3 Encounters:  08/16/19 221 lb 9.6 oz (100.5 kg)  12/12/18 219 lb 3.2 oz (99.4 kg)  12/07/18 205 lb (93 kg)      Physical Exam Vitals reviewed.  Constitutional:      General: She is not in acute distress.    Appearance: Normal appearance. She is well-developed. She is obese. She is not ill-appearing or diaphoretic.  Neck:     Trachea: No tracheal deviation.  Cardiovascular:     Rate and Rhythm: Normal rate and regular rhythm.     Pulses: Normal pulses.     Heart sounds: Normal heart sounds. No murmur. No friction rub. No gallop.   Pulmonary:     Effort: Pulmonary effort is normal. No respiratory distress.     Breath sounds: Normal breath sounds. No wheezing or rales.  Musculoskeletal:     Cervical back: Normal range of motion and neck supple.     Right lower leg: No edema.     Left lower leg: No edema.  Skin:    General: Skin is warm and dry.     Capillary Refill: Capillary refill takes less than 2 seconds.  Neurological:     General: No focal deficit present.     Mental Status: She is alert. Mental status is at baseline.  Psychiatric:        Mood and Affect: Mood normal.        Behavior: Behavior normal.        Thought Content: Thought content normal.        Judgment: Judgment normal.       No results found for any visits on 08/16/19.   Assessment & Plan     1. Essential hypertension New. Start [name] as below. Will f/u BP in 4-6 weeks.  - triamterene-hydrochlorothiazide ([name]-25) 37.5-25 MG tablet; Take 1 tablet by mouth daily.  Dispense: 90 tablet; Refill: 3  2. Recurrent major depressive disorder, in partial remission (HCC) Stable. Continue Lexapro 10mg .   3. Class 2 severe obesity due to excess calories with serious comorbidity and body mass index (BMI) of 38.0 to 38.9 in adult [facility]) Counseled patient on healthy lifestyle modifications including dieting and exercise.   4. 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 Stable. Diagnosis pulled  for medication refill. Continue current medical treatment plan. - norethindrone (AYGESTIN) 5 MG tablet; Take 1 tablet (5 mg total) by mouth at bedtime.  Dispense: 90 tablet; Refill: 3   No follow-ups on file.      Reynolds Bowl, PA-C, have reviewed all documentation for this visit. The documentation on 08/19/19 for the exam, diagnosis, procedures, and orders are all accurate and complete.   Rubye Beach  [facility] [phone] (phone) [phone] (fax)  [place]

## 2019-08-16 NOTE — Patient Instructions (Signed)
DASH Eating Plan DASH stands for "Dietary Approaches to Stop Hypertension." The DASH eating plan is a healthy eating plan that has been shown to reduce high blood pressure (hypertension). It may also reduce your risk for type 2 diabetes, heart disease, and stroke. The DASH eating plan may also help with weight loss. What are tips for following this plan?  General guidelines  Avoid eating more than 2,300 mg (milligrams) of salt (sodium) a day. If you have hypertension, you may need to reduce your sodium intake to 1,500 mg a day.  Limit alcohol intake to no more than 1 drink a day for nonpregnant women and 2 drinks a day for men. One drink equals 12 oz of beer, 5 oz of wine, or 1 oz of hard liquor.  Work with your health care provider to maintain a healthy body weight or to lose weight. Ask what an ideal weight is for you.  Get at least 30 minutes of exercise that causes your heart to beat faster (aerobic exercise) most days of the week. Activities may include walking, swimming, or biking.  Work with your health care provider or diet and nutrition specialist (dietitian) to adjust your eating plan to your individual calorie needs. Reading food labels   Check food labels for the amount of sodium per serving. Choose foods with less than 5 percent of the Daily Value of sodium. Generally, foods with less than 300 mg of sodium per serving fit into this eating plan.  To find whole grains, look for the word "whole" as the first word in the ingredient list. Shopping  Buy products labeled as "low-sodium" or "no salt added."  Buy fresh foods. Avoid canned foods and premade or frozen meals. Cooking  Avoid adding salt when cooking. Use salt-free seasonings or herbs instead of table salt or sea salt. Check with your health care provider or pharmacist before using salt substitutes.  Do not fry foods. Cook foods using healthy methods such as baking, boiling, grilling, and broiling instead.  Cook with  heart-healthy oils, such as olive, canola, soybean, or sunflower oil. Meal planning  Eat a balanced diet that includes: ? 5 or more servings of fruits and vegetables each day. At each meal, try to fill half of your plate with fruits and vegetables. ? Up to 6-8 servings of whole grains each day. ? Less than 6 oz of lean meat, poultry, or fish each day. A 3-oz serving of meat is about the same size as a deck of cards. One egg equals 1 oz. ? 2 servings of low-fat dairy each day. ? A serving of nuts, seeds, or beans 5 times each week. ? Heart-healthy fats. Healthy fats called Omega-3 fatty acids are found in foods such as flaxseeds and coldwater fish, like sardines, salmon, and mackerel.  Limit how much you eat of the following: ? Canned or prepackaged foods. ? Food that is high in trans fat, such as fried foods. ? Food that is high in saturated fat, such as fatty meat. ? Sweets, desserts, sugary drinks, and other foods with added sugar. ? Full-fat dairy products.  Do not salt foods before eating.  Try to eat at least 2 vegetarian meals each week.  Eat more home-cooked food and less restaurant, buffet, and fast food.  When eating at a restaurant, ask that your food be prepared with less salt or no salt, if possible. What foods are recommended? The items listed may not be a complete list. Talk with your dietitian about   what dietary choices are best for you. Grains Whole-grain or whole-wheat bread. Whole-grain or whole-wheat pasta. Brown rice. Oatmeal. Quinoa. Bulgur. Whole-grain and low-sodium cereals. Pita bread. Low-fat, low-sodium crackers. Whole-wheat flour tortillas. Vegetables Fresh or frozen vegetables (raw, steamed, roasted, or grilled). Low-sodium or reduced-sodium tomato and vegetable juice. Low-sodium or reduced-sodium tomato sauce and tomato paste. Low-sodium or reduced-sodium canned vegetables. Fruits All fresh, dried, or frozen fruit. Canned fruit in natural juice (without  added sugar). Meat and other protein foods Skinless chicken or turkey. Ground chicken or turkey. Pork with fat trimmed off. Fish and seafood. Egg whites. Dried beans, peas, or lentils. Unsalted nuts, nut butters, and seeds. Unsalted canned beans. Lean cuts of beef with fat trimmed off. Low-sodium, lean deli meat. Dairy Low-fat (1%) or fat-free (skim) milk. Fat-free, low-fat, or reduced-fat cheeses. Nonfat, low-sodium ricotta or cottage cheese. Low-fat or nonfat yogurt. Low-fat, low-sodium cheese. Fats and oils Soft margarine without trans fats. Vegetable oil. Low-fat, reduced-fat, or light mayonnaise and salad dressings (reduced-sodium). Canola, safflower, olive, soybean, and sunflower oils. Avocado. Seasoning and other foods Herbs. Spices. Seasoning mixes without salt. Unsalted popcorn and pretzels. Fat-free sweets. What foods are not recommended? The items listed may not be a complete list. Talk with your dietitian about what dietary choices are best for you. Grains Baked goods made with fat, such as croissants, muffins, or some breads. Dry pasta or rice meal packs. Vegetables Creamed or fried vegetables. Vegetables in a cheese sauce. Regular canned vegetables (not low-sodium or reduced-sodium). Regular canned tomato sauce and paste (not low-sodium or reduced-sodium). Regular tomato and vegetable juice (not low-sodium or reduced-sodium). Pickles. Olives. Fruits Canned fruit in a light or heavy syrup. Fried fruit. Fruit in cream or butter sauce. Meat and other protein foods Fatty cuts of meat. Ribs. Fried meat. Bacon. Sausage. Bologna and other processed lunch meats. Salami. Fatback. Hotdogs. Bratwurst. Salted nuts and seeds. Canned beans with added salt. Canned or smoked fish. Whole eggs or egg yolks. Chicken or turkey with skin. Dairy Whole or 2% milk, cream, and half-and-half. Whole or full-fat cream cheese. Whole-fat or sweetened yogurt. Full-fat cheese. Nondairy creamers. Whipped toppings.  Processed cheese and cheese spreads. Fats and oils Butter. Stick margarine. Lard. Shortening. Ghee. Bacon fat. Tropical oils, such as coconut, palm kernel, or palm oil. Seasoning and other foods Salted popcorn and pretzels. Onion salt, garlic salt, seasoned salt, table salt, and sea salt. Worcestershire sauce. Tartar sauce. Barbecue sauce. Teriyaki sauce. Soy sauce, including reduced-sodium. Steak sauce. Canned and packaged gravies. Fish sauce. Oyster sauce. Cocktail sauce. Horseradish that you find on the shelf. Ketchup. Mustard. Meat flavorings and tenderizers. Bouillon cubes. Hot sauce and Tabasco sauce. Premade or packaged marinades. Premade or packaged taco seasonings. Relishes. Regular salad dressings. Where to find more information:  National Heart, Lung, and Blood Institute: www.nhlbi.nih.gov  American Heart Association: www.heart.org Summary  The DASH eating plan is a healthy eating plan that has been shown to reduce high blood pressure (hypertension). It may also reduce your risk for type 2 diabetes, heart disease, and stroke.  With the DASH eating plan, you should limit salt (sodium) intake to 2,300 mg a day. If you have hypertension, you may need to reduce your sodium intake to 1,500 mg a day.  When on the DASH eating plan, aim to eat more fresh fruits and vegetables, whole grains, lean proteins, low-fat dairy, and heart-healthy fats.  Work with your health care provider or diet and nutrition specialist (dietitian) to adjust your eating plan to your   individual calorie needs. This information is not intended to replace advice given to you by your health care provider. Make sure you discuss any questions you have with your health care provider. Document Revised: 03/17/2017 Document Reviewed: 03/28/2016 Elsevier Patient Education  [place].   Hydrochlorothiazide, HCTZ; Triamterene Oral Tablets or Capsules What is this medicine? HYDROCHLOROTHIAZIDE; TRIAMTERENE (hye droe  klor oh THYE a zide; trye AM ter een) is a combination of 2 diuretics. It helps you make more urine and to lose salt and excess water from your body. It treats swelling from heart, kidney, or liver disease. It also treats high blood pressure. This medicine may be used for other purposes; ask your health care provider or pharmacist if you have questions. COMMON BRAND NAME(S): Dyazide, [name] What should I tell my health care provider before I take this medicine? They need to know if you have any of these conditions:  diabetes  immune system problems, like lupus  kidney disease or stones  liver disease  small amount of urine or difficulty passing urine  an unusual or allergic reaction to triamterene, hydrochlorothiazide, sulfa drugs, other medicines, foods, dyes, or preservatives  pregnant or trying to get pregnant  breast-feeding How should I use this medicine? Take this drug by mouth. Take it as directed on the prescription label at the same time every day. You can take it with or without food. If it upsets your stomach, take it with food. Keep taking it unless your health care provider tells you to stop. Talk to your health care provider about the use of this drug in children. Special care may be needed. Overdosage: If you think you have taken too much of this medicine contact a poison control center or emergency room at once. NOTE: This medicine is only for you. Do not share this medicine with others. What if I miss a dose? If you miss a dose, take it as soon as you can. If it is almost time for your next dose, take only that dose. Do not take double or extra doses. What may interact with this medicine? Do not take this medicine with any of the following medications:  cidofovir  dofetilide  eplerenone  potassium supplements  tranylcypromine This medicine may also interact with the following medications:  certain medicines for blood pressure, heart disease like benazepril,  lisinopril, losartan, valsartan  lithium  medicines for diabetes  medicines that relax muscles for surgery  NSAIDs, medicines for pain and inflammation, like ibuprofen or naproxen  other diuretics  penicillin G potassium This list may not describe all possible interactions. Give your health care provider a list of all the medicines, herbs, non-prescription drugs, or dietary supplements you use. Also tell them if you smoke, drink alcohol, or use illegal drugs. Some items may interact with your medicine. What should I watch for while using this medicine? Visit your doctor or health care professional for regular check-ups. You will need lab work done before you start this medicine and regularly while you are taking it. Check your blood pressure regularly. Ask your health care professional what your blood pressure should be, and when you should contact them. This medicine may increase blood sugar. Ask your healthcare provider if changes in diet or medicines are needed if you have diabetes. You may need to be on a special diet while taking this medicine. Ask your doctor. Also, ask how many glasses of fluid you need to drink a day. You must not get dehydrated. You may  get drowsy or dizzy. Do not drive, use machinery, or do anything that needs mental alertness until you know how this medicine affects you. Do not stand or sit up quickly, especially if you are an older patient. This reduces the risk of dizzy or fainting spells. Alcohol may interfere with the effect of this medicine. Avoid or limit alcoholic drinks. Talk to your health care professional about your risk of skin cancer. You may be more at risk for skin cancer if you take this medicine. This medicine can make you more sensitive to the sun. Keep out of the sun. If you cannot avoid being in the sun, wear protective clothing and use sunscreen. Do not use sun lamps or tanning beds/booths. What side effects may I notice from receiving this  medicine? Side effects that you should report to your doctor or health care professional as soon as possible:  allergic reactions such as skin rash or itching, hives, swelling of the lips, mouth, tongue, or throat  changes in vision  eye pain  fast or irregular heartbeat, chest pain  feeling faint or dizzy  gout attack  muscle pain or cramps  numbness or tingling in hands, feet, or lips  pain or difficulty when passing urine  redness, blistering, peeling or loosening of the skin, including inside the mouth   signs and symptoms of high blood sugar such as being more thirsty or hungry or having to urinate more than normal. You may also feel very tired or have blurry vision.  shortness of breath  unusually weak Side effects that usually do not require medical attention (report to your doctor or health care professional if they continue or are bothersome):  change in sex drive or performance  dry mouth  headache  stomach upset This list may not describe all possible side effects. Call your doctor for medical advice about side effects. You may report side effects to FDA at 1-800-FDA-1088. Where should I keep my medicine? Keep out of the reach of children and pets. Store at room temperature between 20 and 25 degrees C (68 and 77 degrees F). Protect from light. Throw away any unused drug after the expiration date. NOTE: This sheet is a summary. It may not cover all possible information. If you have questions about this medicine, talk to your doctor, pharmacist, or health care provider.  2020 Elsevier/Gold Standard (2018-12-10 13:00:43)

## 2019-08-28 ENCOUNTER — Ambulatory Visit: Payer: BC Managed Care – PPO | Attending: Internal Medicine

## 2019-08-28 DIAGNOSIS — Z23 Encounter for immunization: Secondary | ICD-10-CM

## 2019-08-28 NOTE — Progress Notes (Signed)
   Covid-19 Vaccination Clinic  Name:  Tracy Booth    MRN: [id number] DOB: [birth year]  08/28/2019  Tracy Booth was observed post Covid-19 immunization for 15 minutes without incident. She was provided with Vaccine Information Sheet and instruction to access the V-Safe system.   Tracy Booth was instructed to call 911 with any severe reactions post vaccine: Marland Kitchen Difficulty breathing  . Swelling of face and throat  . A fast heartbeat  . A bad rash all over body  . Dizziness and weakness   Immunizations Administered    Name Date Dose VIS Date Route   Pfizer COVID-19 Vaccine 08/28/2019  4:37 PM 0.3 mL 06/12/2018 Intramuscular   Manufacturer: [place]   Lot: [id number]   [place]: [id number]

## 2019-09-13 ENCOUNTER — Telehealth (INDEPENDENT_AMBULATORY_CARE_PROVIDER_SITE_OTHER): Payer: BC Managed Care – PPO | Admitting: Physician Assistant

## 2019-09-13 ENCOUNTER — Encounter: Payer: Self-pay | Admitting: Physician Assistant

## 2019-09-13 VITALS — BP 127/78

## 2019-09-13 DIAGNOSIS — I10 Essential (primary) hypertension: Secondary | ICD-10-CM

## 2019-09-13 NOTE — Progress Notes (Signed)
MyChart Video Visit    Virtual Visit via Video Note   This visit type was conducted due to national recommendations for restrictions regarding the COVID-19 Pandemic (e.g. social distancing) in an effort to limit this patient's exposure and mitigate transmission in our community. This patient is at least at moderate risk for complications without adequate follow up. This format is felt to be most appropriate for this patient at this time. Physical exam was limited by quality of the video and audio technology used for the visit.   Interactive audio and video communications were attempted, although failed due to patient's inability to connect to video. Continued visit with audio only interaction with patient agreement.  Patient location: Home Provider location: [facility]   Patient: Tracy Booth   DOB: Aug 20, 1970   49 y.o. Female  MRN: [id number] Visit Date: 09/13/2019  Today's healthcare provider: Mar Daring, PA-C   Chief Complaint  Patient presents with  . Hypertension   Subjective    HPI   Hypertension, follow-up  BP Readings from Last 3 Encounters:  09/13/19 127/78  08/16/19 (!) 162/97  12/12/18 (!) 171/89   Wt Readings from Last 3 Encounters:  08/16/19 221 lb 9.6 oz (100.5 kg)  12/12/18 219 lb 3.2 oz (99.4 kg)  12/07/18 205 lb (93 kg)     She was last seen for hypertension 1 months ago.  BP at that visit was 162/97. Management since that visit includes Started [name] 37.5/25mg .  She reports excellent compliance with treatment. She is not having side effects.  She is following a Low Sodium diet. She is exercising. She does not smoke.  Use of agents associated with hypertension: none.   Outside blood pressures are . Symptoms: No chest pain No chest pressure  No palpitations No syncope  No dyspnea No orthopnea  No paroxysmal nocturnal dyspnea No lower extremity edema   Pertinent labs: Lab Results  Component Value Date   CHOL 210 (H) 08/24/2018   HDL 43 08/24/2018   LDLCALC 150 (H) 08/24/2018   TRIG 87 08/24/2018   CHOLHDL 4.9 (H) 08/24/2018   Lab Results  Component Value Date   NA 142 08/24/2018   K 4.4 08/24/2018   CREATININE 0.98 08/24/2018   GFRNONAA 67 08/24/2018   GFRAA 78 08/24/2018   GLUCOSE 80 08/24/2018     The 10-year ASCVD risk score Mikey Bussing DC Jr., et al., 2013) is: 2.6%   ---------------------------------------------------------------------------------------------------   Patient Active Problem List   Diagnosis Date Noted  . Recurrent major depressive disorder, in partial remission ([place]) 08/24/2018  . Vertigo 11/25/2013  . Esophageal reflux 11/25/2013  . Metrorrhagia 08/18/2010   Past Medical History:  Diagnosis Date  . Complication of anesthesia   . Dizziness    occas  . Heart murmur   . History of infertility   . Obesity   . PONV (postoperative nausea and vomiting)   . Seasonal allergies   . Spontaneous pneumothorax    Social History   Tobacco Use  . Smoking status: Never Smoker  . Smokeless tobacco: Never Used  Substance Use Topics  . Alcohol use: Yes    Comment: rare  . Drug use: Never   No Known Allergies  Medications: Outpatient Medications Prior to Visit  Medication Sig  . acetaminophen (TYLENOL) 500 MG tablet Take 500 mg by mouth daily as needed for moderate pain or headache.  . cetirizine (ZYRTEC) 10 MG tablet Take 10 mg by mouth daily as needed for allergies.  Marland Kitchen  escitalopram (LEXAPRO) 10 MG tablet TAKE 1 TABLET AT BEDTIME  . famotidine (PEPCID AC) 10 MG tablet Take 10 mg by mouth daily as needed for heartburn or indigestion.  . norethindrone (AYGESTIN) 5 MG tablet Take 1 tablet (5 mg total) by mouth at bedtime.  . Probiotic CAPS Take 2 capsules by mouth at bedtime.  . triamterene-hydrochlorothiazide ([name]-25) 37.5-25 MG tablet Take 1 tablet by mouth daily.   No facility-administered medications prior to visit.    Review of Systems  Constitutional: Negative.     Respiratory: Negative.   Cardiovascular: Negative.   Neurological: Negative.     Last CBC Lab Results  Component Value Date   WBC 6.4 08/24/2018   HGB 14.1 08/24/2018   HCT 39.8 08/24/2018   MCV 88 08/24/2018   MCH 31.1 08/24/2018   RDW 12.3 08/24/2018   PLT 359 [id number]   Last metabolic panel Lab Results  Component Value Date   GLUCOSE 80 08/24/2018   NA 142 08/24/2018   K 4.4 08/24/2018   CL 107 (H) 08/24/2018   CO2 20 08/24/2018   BUN 8 08/24/2018   CREATININE 0.98 08/24/2018   GFRNONAA 67 08/24/2018   GFRAA 78 08/24/2018   CALCIUM 8.5 (L) 08/24/2018   PROT 6.4 08/24/2018   ALBUMIN 4.1 08/24/2018   LABGLOB 2.3 08/24/2018   AGRATIO 1.8 08/24/2018   BILITOT 0.3 08/24/2018   ALKPHOS 74 08/24/2018   AST 18 08/24/2018   ALT 14 08/24/2018   Last lipids Lab Results  Component Value Date   CHOL 210 (H) 08/24/2018   HDL 43 08/24/2018   LDLCALC 150 (H) 08/24/2018   TRIG 87 08/24/2018   CHOLHDL 4.9 (H) 08/24/2018   Last hemoglobin A1c Lab Results  Component Value Date   HGBA1C 5.3 08/24/2018   Last thyroid functions Lab Results  Component Value Date   TSH 1.590 08/24/2018   Last vitamin D No results found for: 25OHVITD2, 25OHVITD3, VD25OH Last vitamin B12 and Folate No results found for: VITAMINB12, FOLATE  Objective    BP 127/78 (BP Location: Left Arm, Patient Position: Sitting, Cuff Size: Large)  BP Readings from Last 3 Encounters:  09/13/19 127/78  08/16/19 (!) 162/97  12/12/18 (!) 171/89      Physical Exam Vitals reviewed.  Constitutional:      General: She is not in acute distress. Pulmonary:     Effort: No respiratory distress.  Neurological:     Mental Status: She is alert.  Psychiatric:        Mood and Affect: Mood normal.        Thought Content: Thought content normal.        Assessment & Plan     1. Essential hypertension BP much improved. Continue [name]. F/U in one year for CPE.    No follow-ups on file.     I  discussed the assessment and treatment plan with the patient. The patient was provided an opportunity to ask questions and all were answered. The patient agreed with the plan and demonstrated an understanding of the instructions.   The patient was advised to call back or seek an in-person evaluation if the symptoms worsen or if the condition fails to improve as anticipated.  I provided 7 minutes of non-face-to-face time during this encounter.  Reynolds Bowl, PA-C, have reviewed all documentation for this visit. The documentation on 09/13/19 for the exam, diagnosis, procedures, and orders are all accurate and complete.  Mar Daring, PA-C [facility]  [phone] (phone) [phone] (fax)  [place]

## 2019-09-13 NOTE — Patient Instructions (Signed)
DASH Eating Plan DASH stands for "Dietary Approaches to Stop Hypertension." The DASH eating plan is a healthy eating plan that has been shown to reduce high blood pressure (hypertension). It may also reduce your risk for type 2 diabetes, heart disease, and stroke. The DASH eating plan may also help with weight loss. What are tips for following this plan?  General guidelines  Avoid eating more than 2,300 mg (milligrams) of salt (sodium) a day. If you have hypertension, you may need to reduce your sodium intake to 1,500 mg a day.  Limit alcohol intake to no more than 1 drink a day for nonpregnant women and 2 drinks a day for men. One drink equals 12 oz of beer, 5 oz of wine, or 1 oz of hard liquor.  Work with your health care provider to maintain a healthy body weight or to lose weight. Ask what an ideal weight is for you.  Get at least 30 minutes of exercise that causes your heart to beat faster (aerobic exercise) most days of the week. Activities may include walking, swimming, or biking.  Work with your health care provider or diet and nutrition specialist (dietitian) to adjust your eating plan to your individual calorie needs. Reading food labels   Check food labels for the amount of sodium per serving. Choose foods with less than 5 percent of the Daily Value of sodium. Generally, foods with less than 300 mg of sodium per serving fit into this eating plan.  To find whole grains, look for the word "whole" as the first word in the ingredient list. Shopping  Buy products labeled as "low-sodium" or "no salt added."  Buy fresh foods. Avoid canned foods and premade or frozen meals. Cooking  Avoid adding salt when cooking. Use salt-free seasonings or herbs instead of table salt or sea salt. Check with your health care provider or pharmacist before using salt substitutes.  Do not fry foods. Cook foods using healthy methods such as baking, boiling, grilling, and broiling instead.  Cook with  heart-healthy oils, such as olive, canola, soybean, or sunflower oil. Meal planning  Eat a balanced diet that includes: ? 5 or more servings of fruits and vegetables each day. At each meal, try to fill half of your plate with fruits and vegetables. ? Up to 6-8 servings of whole grains each day. ? Less than 6 oz of lean meat, poultry, or fish each day. A 3-oz serving of meat is about the same size as a deck of cards. One egg equals 1 oz. ? 2 servings of low-fat dairy each day. ? A serving of nuts, seeds, or beans 5 times each week. ? Heart-healthy fats. Healthy fats called Omega-3 fatty acids are found in foods such as flaxseeds and coldwater fish, like sardines, salmon, and mackerel.  Limit how much you eat of the following: ? Canned or prepackaged foods. ? Food that is high in trans fat, such as fried foods. ? Food that is high in saturated fat, such as fatty meat. ? Sweets, desserts, sugary drinks, and other foods with added sugar. ? Full-fat dairy products.  Do not salt foods before eating.  Try to eat at least 2 vegetarian meals each week.  Eat more home-cooked food and less restaurant, buffet, and fast food.  When eating at a restaurant, ask that your food be prepared with less salt or no salt, if possible. What foods are recommended? The items listed may not be a complete list. Talk with your dietitian about   what dietary choices are best for you. Grains Whole-grain or whole-wheat bread. Whole-grain or whole-wheat pasta. Brown rice. Oatmeal. Quinoa. Bulgur. Whole-grain and low-sodium cereals. Pita bread. Low-fat, low-sodium crackers. Whole-wheat flour tortillas. Vegetables Fresh or frozen vegetables (raw, steamed, roasted, or grilled). Low-sodium or reduced-sodium tomato and vegetable juice. Low-sodium or reduced-sodium tomato sauce and tomato paste. Low-sodium or reduced-sodium canned vegetables. Fruits All fresh, dried, or frozen fruit. Canned fruit in natural juice (without  added sugar). Meat and other protein foods Skinless chicken or turkey. Ground chicken or turkey. Pork with fat trimmed off. Fish and seafood. Egg whites. Dried beans, peas, or lentils. Unsalted nuts, nut butters, and seeds. Unsalted canned beans. Lean cuts of beef with fat trimmed off. Low-sodium, lean deli meat. Dairy Low-fat (1%) or fat-free (skim) milk. Fat-free, low-fat, or reduced-fat cheeses. Nonfat, low-sodium ricotta or cottage cheese. Low-fat or nonfat yogurt. Low-fat, low-sodium cheese. Fats and oils Soft margarine without trans fats. Vegetable oil. Low-fat, reduced-fat, or light mayonnaise and salad dressings (reduced-sodium). Canola, safflower, olive, soybean, and sunflower oils. Avocado. Seasoning and other foods Herbs. Spices. Seasoning mixes without salt. Unsalted popcorn and pretzels. Fat-free sweets. What foods are not recommended? The items listed may not be a complete list. Talk with your dietitian about what dietary choices are best for you. Grains Baked goods made with fat, such as croissants, muffins, or some breads. Dry pasta or rice meal packs. Vegetables Creamed or fried vegetables. Vegetables in a cheese sauce. Regular canned vegetables (not low-sodium or reduced-sodium). Regular canned tomato sauce and paste (not low-sodium or reduced-sodium). Regular tomato and vegetable juice (not low-sodium or reduced-sodium). Pickles. Olives. Fruits Canned fruit in a light or heavy syrup. Fried fruit. Fruit in cream or butter sauce. Meat and other protein foods Fatty cuts of meat. Ribs. Fried meat. Bacon. Sausage. Bologna and other processed lunch meats. Salami. Fatback. Hotdogs. Bratwurst. Salted nuts and seeds. Canned beans with added salt. Canned or smoked fish. Whole eggs or egg yolks. Chicken or turkey with skin. Dairy Whole or 2% milk, cream, and half-and-half. Whole or full-fat cream cheese. Whole-fat or sweetened yogurt. Full-fat cheese. Nondairy creamers. Whipped toppings.  Processed cheese and cheese spreads. Fats and oils Butter. Stick margarine. Lard. Shortening. Ghee. Bacon fat. Tropical oils, such as coconut, palm kernel, or palm oil. Seasoning and other foods Salted popcorn and pretzels. Onion salt, garlic salt, seasoned salt, table salt, and sea salt. Worcestershire sauce. Tartar sauce. Barbecue sauce. Teriyaki sauce. Soy sauce, including reduced-sodium. Steak sauce. Canned and packaged gravies. Fish sauce. Oyster sauce. Cocktail sauce. Horseradish that you find on the shelf. Ketchup. Mustard. Meat flavorings and tenderizers. Bouillon cubes. Hot sauce and Tabasco sauce. Premade or packaged marinades. Premade or packaged taco seasonings. Relishes. Regular salad dressings. Where to find more information:  National Heart, Lung, and Blood Institute: www.nhlbi.nih.gov  American Heart Association: www.heart.org Summary  The DASH eating plan is a healthy eating plan that has been shown to reduce high blood pressure (hypertension). It may also reduce your risk for type 2 diabetes, heart disease, and stroke.  With the DASH eating plan, you should limit salt (sodium) intake to 2,300 mg a day. If you have hypertension, you may need to reduce your sodium intake to 1,500 mg a day.  When on the DASH eating plan, aim to eat more fresh fruits and vegetables, whole grains, lean proteins, low-fat dairy, and heart-healthy fats.  Work with your health care provider or diet and nutrition specialist (dietitian) to adjust your eating plan to your   individual calorie needs. This information is not intended to replace advice given to you by your health care provider. Make sure you discuss any questions you have with your health care provider. Document Revised: 03/17/2017 Document Reviewed: 03/28/2016 Elsevier Patient Education  2020 Elsevier Inc.  

## 2020-04-04 DIAGNOSIS — Z20822 Contact with and (suspected) exposure to covid-19: Secondary | ICD-10-CM | POA: Diagnosis not present

## 2020-04-12 DIAGNOSIS — Z20822 Contact with and (suspected) exposure to covid-19: Secondary | ICD-10-CM | POA: Diagnosis not present

## 2020-05-11 ENCOUNTER — Encounter: Payer: Self-pay | Admitting: Physician Assistant

## 2020-05-11 DIAGNOSIS — F3341 Major depressive disorder, recurrent, in partial remission: Secondary | ICD-10-CM

## 2020-05-11 DIAGNOSIS — I10 Essential (primary) hypertension: Secondary | ICD-10-CM

## 2020-05-11 DIAGNOSIS — N951 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: Secondary | ICD-10-CM

## 2020-05-12 MED ORDER — ESCITALOPRAM OXALATE 10 MG PO TABS: 10.0000 mg | ORAL_TABLET | Freq: Every day | ORAL | 1 refills | Status: DC

## 2020-05-12 MED ORDER — NORETHINDRONE ACETATE 5 MG PO TABS: 5.0000 mg | ORAL_TABLET | Freq: Every day | ORAL | 1 refills | Status: DC

## 2020-05-12 MED ORDER — TRIAMTERENE-HCTZ 37.5-25 MG PO TABS: 1.0000 | ORAL_TABLET | Freq: Every day | ORAL | 1 refills | Status: DC

## 2020-06-09 ENCOUNTER — Encounter: Payer: Self-pay | Admitting: Physician Assistant

## 2020-06-09 ENCOUNTER — Ambulatory Visit (INDEPENDENT_AMBULATORY_CARE_PROVIDER_SITE_OTHER): Payer: BC Managed Care – PPO | Admitting: Physician Assistant

## 2020-06-09 ENCOUNTER — Other Ambulatory Visit: Payer: Self-pay

## 2020-06-09 VITALS — BP 119/79 | HR 83 | Temp 98.4°F | Ht 64.0 in | Wt 221.0 lb

## 2020-06-09 DIAGNOSIS — Z0000 Encounter for general adult medical examination without abnormal findings: Secondary | ICD-10-CM

## 2020-06-09 DIAGNOSIS — Z6837 Body mass index (BMI) 37.0-37.9, adult: Secondary | ICD-10-CM

## 2020-06-09 DIAGNOSIS — Z23 Encounter for immunization: Secondary | ICD-10-CM

## 2020-06-09 DIAGNOSIS — Z1239 Encounter for other screening for malignant neoplasm of breast: Secondary | ICD-10-CM | POA: Diagnosis not present

## 2020-06-09 DIAGNOSIS — Z1159 Encounter for screening for other viral diseases: Secondary | ICD-10-CM

## 2020-06-09 NOTE — Progress Notes (Signed)
Complete physical exam   Patient: Tracy Booth   DOB: 02/09/1971   50 y.o. Female  MRN: [id number] Visit Date: 06/09/2020  Today's healthcare provider: Mar Daring, PA-C   Chief Complaint  Patient presents with  . Annual Exam   Subjective    Tracy Booth is a 49 y.o. female who presents today for a complete physical exam.  She reports consuming a general diet. Exercises regularly. She generally feels well. She reports sleeping fairly well. She does not have additional problems to discuss today.  HPI    Past Medical History:  Diagnosis Date  . Complication of anesthesia   . Dizziness    occas  . Heart murmur   . History of infertility   . Obesity   . PONV (postoperative nausea and vomiting)   . Seasonal allergies   . Spontaneous pneumothorax    Past Surgical History:  Procedure Laterality Date  . ABLATION ON ENDOMETRIOSIS  2012  . COLONOSCOPY WITH PROPOFOL N/A 12/07/2018   Procedure: COLONOSCOPY WITH PROPOFOL;  Surgeon: Jonathon Bellows, MD;  Location: [facility] ENDOSCOPY;  Service: Gastroenterology;  Laterality: N/A;  . LUNG SURGERY Left 1997 and 1999  . TONSILLECTOMY  1975  . ULNAR COLLATERAL LIGAMENT REPAIR Left 08/31/2017   Procedure: ULNAR COLLATERAL LIGAMENT REPAIR- LEFT THUMB;  Surgeon: Corky Mull, MD;  Location: [facility] ORS;  Service: Orthopedics;  Laterality: Left;   Social History   Socioeconomic History  . Marital status: Married    Spouse name: Not on file  . Number of children: Not on file  . Years of education: Not on file  . Highest education level: Not on file  Occupational History  . Not on file  Tobacco Use  . Smoking status: Never Smoker  . Smokeless tobacco: Never Used  Vaping Use  . Vaping Use: Never used  Substance and Sexual Activity  . Alcohol use: Yes    Comment: rare  . Drug use: Never  . Sexual activity: Not on file  Other Topics Concern  . Not on file  Social History Narrative  . Not on file   Social Determinants  of Health   Financial Resource Strain: Not on file  Food Insecurity: Not on file  Transportation Needs: Not on file  Physical Activity: Not on file  Stress: Not on file  Social Connections: Not on file  Intimate Partner Violence: Not on file   Family Status  Relation Name Status  . Mother  Alive  . Father  Alive  . MGM  Deceased  . Brother  Alive  . Son  Alive   Family History  Problem Relation Age of Onset  . Hypertension Mother   . Arthritis Mother   . Fibromyalgia Mother   . Healthy Father   . Breast cancer Maternal Grandmother   . Ovarian cancer Maternal Grandmother   . Healthy Brother    No Known Allergies  Patient Care Team: Mar Daring, PA-C as PCP - General (Family Medicine)   Medications: Outpatient Medications Prior to Visit  Medication Sig  . acetaminophen (TYLENOL) 500 MG tablet Take 500 mg by mouth daily as needed for moderate pain or headache.  . cetirizine (ZYRTEC) 10 MG tablet Take 10 mg by mouth daily as needed for allergies.  Marland Kitchen escitalopram (LEXAPRO) 10 MG tablet Take 1 tablet (10 mg total) by mouth at bedtime.  . famotidine (PEPCID AC) 10 MG tablet Take 10 mg by mouth daily as needed for heartburn  or indigestion.  . norethindrone (AYGESTIN) 5 MG tablet Take 1 tablet (5 mg total) by mouth at bedtime.  . Probiotic CAPS Take 2 capsules by mouth at bedtime.  . triamterene-hydrochlorothiazide ([name]-25) 37.5-25 MG tablet Take 1 tablet by mouth daily.   No facility-administered medications prior to visit.    Review of Systems  Constitutional: Negative.   HENT: Negative.   Eyes: Negative.   Respiratory: Negative.   Cardiovascular: Negative.   Gastrointestinal: Negative.   Endocrine: Negative.   Genitourinary: Negative.   Musculoskeletal: Negative.   Skin: Negative.   Allergic/Immunologic: Negative.   Neurological: Negative.   Hematological: Negative.   Psychiatric/Behavioral: Negative.       Objective    Temp 98.4 F (36.9 C)  (Oral)   Ht 5\' 4"  (1.626 m)   Wt 221 lb (100.2 kg)   BMI 37.93 kg/m    Physical Exam Vitals reviewed.  Constitutional:      General: She is not in acute distress.    Appearance: Normal appearance. She is well-developed and well-nourished. She is obese. She is not ill-appearing or diaphoretic.  HENT:     Head: Normocephalic and atraumatic.     Right Ear: Tympanic membrane, ear canal and external ear normal.     Left Ear: Tympanic membrane, ear canal and external ear normal.     Mouth/Throat:     Mouth: Oropharynx is clear and moist.  Eyes:     General: No scleral icterus.       Right eye: No discharge.        Left eye: No discharge.     Extraocular Movements: Extraocular movements intact and EOM normal.     Conjunctiva/sclera: Conjunctivae normal.     Pupils: Pupils are equal, round, and reactive to light.  Neck:     Thyroid: No thyromegaly.     Vascular: No carotid bruit or JVD.     Trachea: No tracheal deviation.  Cardiovascular:     Rate and Rhythm: Normal rate and regular rhythm.     Pulses: Normal pulses and intact distal pulses.     Heart sounds: Normal heart sounds. No murmur heard. No friction rub. No gallop.   Pulmonary:     Effort: Pulmonary effort is normal. No respiratory distress.     Breath sounds: Normal breath sounds. No wheezing or rales.  Chest:     Chest wall: No tenderness.  Abdominal:     General: Abdomen is flat. Bowel sounds are normal. There is no distension.     Palpations: Abdomen is soft. There is no mass.     Tenderness: There is no abdominal tenderness. There is no guarding or rebound.  Musculoskeletal:        General: No tenderness or edema. Normal range of motion.     Cervical back: Normal range of motion and neck supple. No tenderness.     Right lower leg: No edema.     Left lower leg: No edema.  Lymphadenopathy:     Cervical: No cervical adenopathy.  Skin:    General: Skin is warm and dry.     Capillary Refill: Capillary refill takes  less than 2 seconds.     Findings: No rash.  Neurological:     General: No focal deficit present.     Mental Status: She is alert and oriented to person, place, and time. Mental status is at baseline.  Psychiatric:        Mood and Affect: Mood and affect and mood  normal.        Behavior: Behavior normal.        Thought Content: Thought content normal.        Judgment: Judgment normal.     Last depression screening scores PHQ 2/9 Scores 08/16/2019 08/24/2018 03/17/2017  PHQ - 2 Score 1 1 0  PHQ- 9 Score 6 7 2    Last fall risk screening Fall Risk  08/24/2018  Falls in the past year? 1   Last Audit-C alcohol use screening Alcohol Use Disorder Test (AUDIT) 08/24/2018  1. How often do you have a drink containing alcohol? 1  2. How many drinks containing alcohol do you have on a typical day when you are drinking? 0  3. How often do you have six or more drinks on one occasion? 0  AUDIT-C Score 1   A score of 3 or more in women, and 4 or more in men indicates increased risk for alcohol abuse, EXCEPT if all of the points are from question 1   No results found for any visits on 06/09/20.  Assessment & Plan    Routine Health Maintenance and Physical Exam  Exercise Activities and Dietary recommendations Goals   None     Immunization History  Administered Date(s) Administered  . PFIZER(Purple Top)SARS-COV-2 Vaccination 08/03/2019, 08/28/2019, 05/02/2020  . Tdap 11/25/2012    Health Maintenance  Topic Date Due  . Hepatitis C Screening  Never done  . INFLUENZA VACCINE  07/16/2020 (Originally 11/17/2019)  . MAMMOGRAM  06/13/2021  . PAP SMEAR-Modifier  08/23/2021  . COLONOSCOPY (Pts 45-19yrs Insurance coverage will need to be confirmed)  01/29/2022  . TETANUS/TDAP  11/26/2022  . COVID-19 Vaccine  Completed  . HIV Screening  Completed    Discussed health benefits of physical activity, and encouraged her to engage in regular exercise appropriate for her age and condition.  1. Annual  physical exam Normal physical exam today. Will check labs as below and f/u pending lab results. If labs are stable and WNL she will not need to have these rechecked for one year at her next annual physical exam. She is to call the office in the meantime if she has any acute issue, questions or concerns. - CBC w/Diff/Platelet - Comprehensive Metabolic Panel (CMET) - TSH - Lipid Panel With LDL/HDL Ratio - HgB A1c  2. Encounter for breast cancer screening using non-mammogram modality There is no family history of breast cancer. She does perform regular self breast exams. Mammogram was ordered as below. Information for [facility] clinic was given to patient so she may schedule her mammogram at her convenience. - MM 3D SCREEN BREAST BILATERAL; Future  3. Need for hepatitis C screening test Will check labs as below and f/u pending results. - Hepatitis C Antibody  4. Class 2 severe obesity due to excess calories with serious comorbidity and body mass index (BMI) of 37.0 to 37.9 in adult [facility]) Counseled patient on healthy lifestyle modifications including dieting and exercise.  Will check labs as below and f/u pending results. Will refer to bariatric surgery for further information about lapband as she is curious of this procedure.  - Comprehensive Metabolic Panel (CMET) - TSH - Lipid Panel With LDL/HDL Ratio - HgB A1c - Amb Referral to Bariatric Surgery  5. Need for shingles vaccine Shingrix Vaccine #1 given to patient without complications. Patient sat for 15 minutes after administration and was tolerated well without adverse effects. Return in 2 months for next shingrix vaccine.  - Varicella-zoster vaccine  IM (Shingrix)   No follow-ups on file.     Reynolds Bowl, PA-C, have reviewed all documentation for this visit. The documentation on 06/09/20 for the exam, diagnosis, procedures, and orders are all accurate and complete.   Rubye Beach  [facility] [phone] (phone) [phone] (fax)  [place]

## 2020-06-09 NOTE — Patient Instructions (Signed)
Preventive Care 52-52 Years Old, Female Preventive care refers to lifestyle choices and visits with your health care provider that can promote health and wellness. This includes:  A yearly physical exam. This is also called an annual wellness visit.  Regular dental and eye exams.  Immunizations.  Screening for certain conditions.  Healthy lifestyle choices, such as: ? Eating a healthy diet. ? Getting regular exercise. ? Not using drugs or products that contain nicotine and tobacco. ? Limiting alcohol use. What can I expect for my preventive care visit? Physical exam Your health care provider will check your:  Height and weight. These may be used to calculate your BMI (body mass index). BMI is a measurement that tells if you are at a healthy weight.  Heart rate and blood pressure.  Body temperature.  Skin for abnormal spots. Counseling Your health care provider may ask you questions about your:  Past medical problems.  Family's medical history.  Alcohol, tobacco, and drug use.  Emotional well-being.  Home life and relationship well-being.  Sexual activity.  Diet, exercise, and sleep habits.  Work and work Statistician.  Access to firearms.  Method of birth control.  Menstrual cycle.  Pregnancy history. What immunizations do I need? Vaccines are usually given at various ages, according to a schedule. Your health care provider will recommend vaccines for you based on your age, medical history, and lifestyle or other factors, such as travel or where you work.   What tests do I need? Blood tests  Lipid and cholesterol levels. These may be checked every 5 years, or more often if you are over 3 years old.  Hepatitis C test.  Hepatitis B test. Screening  Lung cancer screening. You may have this screening every year starting at age 52 if you have a 30-pack-year history of smoking and currently smoke or have quit within the past 15 years.  Colorectal cancer  screening. ? All adults should have this screening starting at age 52 and continuing until age 17. ? Your health care provider may recommend screening at age 49 if you are at increased risk. ? You will have tests every 1-10 years, depending on your results and the type of screening test.  Diabetes screening. ? This is done by checking your blood sugar (glucose) after you have not eaten for a while (fasting). ? You may have this done every 1-3 years.  Mammogram. ? This may be done every 1-2 years. ? Talk with your health care provider about when you should start having regular mammograms. This may depend on whether you have a family history of breast cancer.  BRCA-related cancer screening. This may be done if you have a family history of breast, ovarian, tubal, or peritoneal cancers.  Pelvic exam and Pap test. ? This may be done every 3 years starting at age 10. ? Starting at age 11, this may be done every 5 years if you have a Pap test in combination with an HPV test. Other tests  STD (sexually transmitted disease) testing, if you are at risk.  Bone density scan. This is done to screen for osteoporosis. You may have this scan if you are at high risk for osteoporosis. Talk with your health care provider about your test results, treatment options, and if necessary, the need for more tests. Follow these instructions at home: Eating and drinking  Eat a diet that includes fresh fruits and vegetables, whole grains, lean protein, and low-fat dairy products.  Take vitamin and mineral supplements  as recommended by your health care provider.  Do not drink alcohol if: ? Your health care provider tells you not to drink. ? You are pregnant, may be pregnant, or are planning to become pregnant.  If you drink alcohol: ? Limit how much you have to 0-1 drink a day. ? Be aware of how much alcohol is in your drink. In the [place] one drink equals one 12 oz bottle of beer (355 mL), one 5 oz glass of  wine (148 mL), or one 1 oz glass of hard liquor (44 mL).   Lifestyle  Take daily care of your teeth and gums. Brush your teeth every morning and night with fluoride toothpaste. Floss one time each day.  Stay active. Exercise for at least 30 minutes 5 or more days each week.  Do not use any products that contain nicotine or tobacco, such as cigarettes, e-cigarettes, and chewing tobacco. If you need help quitting, ask your health care provider.  Do not use drugs.  If you are sexually active, practice safe sex. Use a condom or other form of protection to prevent STIs (sexually transmitted infections).  If you do not wish to become pregnant, use a form of birth control. If you plan to become pregnant, see your health care provider for a prepregnancy visit.  If told by your health care provider, take low-dose aspirin daily starting at age 50.  Find healthy ways to cope with stress, such as: ? Meditation, yoga, or listening to music. ? Journaling. ? Talking to a trusted person. ? Spending time with friends and family. Safety  Always wear your seat belt while driving or riding in a vehicle.  Do not drive: ? If you have been drinking alcohol. Do not ride with someone who has been drinking. ? When you are tired or distracted. ? While texting.  Wear a helmet and other protective equipment during sports activities.  If you have firearms in your house, make sure you follow all gun safety procedures. What's next?  Visit your health care provider once a year for an annual wellness visit.  Ask your health care provider how often you should have your eyes and teeth checked.  Stay up to date on all vaccines. This information is not intended to replace advice given to you by your health care provider. Make sure you discuss any questions you have with your health care provider. Document Revised: 01/07/2020 Document Reviewed: 12/14/2017 Elsevier Patient Education  2021 Elsevier Inc.  

## 2020-06-10 LAB — CBC WITH DIFFERENTIAL/PLATELET
Basophils Absolute: 0.1 10*3/uL (ref 0.0–0.2)
Basos: 1 %
EOS (ABSOLUTE): 0.1 10*3/uL (ref 0.0–0.4)
Eos: 1 %
Hematocrit: 37.8 % (ref 34.0–46.6)
Hemoglobin: 12.7 g/dL (ref 11.1–15.9)
Immature Grans (Abs): 0 10*3/uL (ref 0.0–0.1)
Immature Granulocytes: 0 %
Lymphocytes Absolute: 2.1 10*3/uL (ref 0.7–3.1)
Lymphs: 30 %
MCH: 30.3 pg (ref 26.6–33.0)
MCHC: 33.6 g/dL (ref 31.5–35.7)
MCV: 90 fL (ref 79–97)
Monocytes Absolute: 0.5 10*3/uL (ref 0.1–0.9)
Monocytes: 7 %
Neutrophils Absolute: 4.4 10*3/uL (ref 1.4–7.0)
Neutrophils: 61 %
Platelets: 374 10*3/uL (ref 150–450)
RBC: 4.19 x10E6/uL (ref 3.77–5.28)
RDW: 12.2 % (ref 11.7–15.4)
WBC: 7.1 10*3/uL (ref 3.4–10.8)

## 2020-06-10 LAB — COMPREHENSIVE METABOLIC PANEL
ALT: 20 IU/L (ref 0–32)
AST: 23 IU/L (ref 0–40)
Albumin/Globulin Ratio: 1.5 (ref 1.2–2.2)
Albumin: 4 g/dL (ref 3.8–4.9)
Alkaline Phosphatase: 78 IU/L (ref 44–121)
BUN/Creatinine Ratio: 12 (ref 9–23)
BUN: 18 mg/dL (ref 6–24)
Bilirubin Total: <0.2 mg/dL (ref 0.0–1.2)
CO2: 22 mmol/L (ref 20–29)
Calcium: 8.6 mg/dL — ABNORMAL LOW (ref 8.7–10.2)
Chloride: 99 mmol/L (ref 96–106)
Creatinine, Ser: 1.46 mg/dL — ABNORMAL HIGH (ref 0.57–1.00)
GFR calc Af Amer: 48 mL/min/{1.73_m2} — ABNORMAL LOW (ref >59)
GFR calc non Af Amer: 41 mL/min/{1.73_m2} — ABNORMAL LOW (ref >59)
Globulin, Total: 2.6 g/dL (ref 1.5–4.5)
Glucose: 87 mg/dL (ref 65–99)
Potassium: 3.7 mmol/L (ref 3.5–5.2)
Sodium: 137 mmol/L (ref 134–144)
Total Protein: 6.6 g/dL (ref 6.0–8.5)

## 2020-06-10 LAB — TSH: TSH: 2.08 u[IU]/mL (ref 0.450–4.500)

## 2020-06-10 LAB — LIPID PANEL WITH LDL/HDL RATIO
Cholesterol, Total: 198 mg/dL (ref 100–199)
HDL: 45 mg/dL (ref >39)
LDL Chol Calc (NIH): 140 mg/dL — ABNORMAL HIGH (ref 0–99)
LDL/HDL Ratio: 3.1 ratio (ref 0.0–3.2)
Triglycerides: 72 mg/dL (ref 0–149)
VLDL Cholesterol Cal: 13 mg/dL (ref 5–40)

## 2020-06-10 LAB — HEPATITIS C ANTIBODY: Hep C Virus Ab: <0.1 s/co ratio (ref 0.0–0.9)

## 2020-06-10 LAB — HEMOGLOBIN A1C
Est. average glucose Bld gHb Est-mCnc: 114 mg/dL
Hgb A1c MFr Bld: 5.6 % (ref 4.8–5.6)

## 2020-06-26 ENCOUNTER — Ambulatory Visit (INDEPENDENT_AMBULATORY_CARE_PROVIDER_SITE_OTHER): Payer: BC Managed Care – PPO | Admitting: Physician Assistant

## 2020-06-26 ENCOUNTER — Other Ambulatory Visit: Payer: Self-pay

## 2020-06-26 DIAGNOSIS — N289 Disorder of kidney and ureter, unspecified: Secondary | ICD-10-CM

## 2020-06-27 LAB — BASIC METABOLIC PANEL
BUN/Creatinine Ratio: 15 (ref 9–23)
BUN: 19 mg/dL (ref 6–24)
CO2: 23 mmol/L (ref 20–29)
Calcium: 9.4 mg/dL (ref 8.7–10.2)
Chloride: 100 mmol/L (ref 96–106)
Creatinine, Ser: 1.28 mg/dL — ABNORMAL HIGH (ref 0.57–1.00)
Glucose: 91 mg/dL (ref 65–99)
Potassium: 3.8 mmol/L (ref 3.5–5.2)
Sodium: 139 mmol/L (ref 134–144)
eGFR: 51 mL/min/{1.73_m2} — ABNORMAL LOW (ref >59)

## 2020-06-29 ENCOUNTER — Encounter: Payer: Self-pay | Admitting: Physician Assistant

## 2020-07-05 NOTE — Progress Notes (Signed)
Patient needed lab only. No visit required

## 2020-07-23 DIAGNOSIS — D225 Melanocytic nevi of trunk: Secondary | ICD-10-CM | POA: Diagnosis not present

## 2020-07-23 DIAGNOSIS — D2262 Melanocytic nevi of left upper limb, including shoulder: Secondary | ICD-10-CM | POA: Diagnosis not present

## 2020-07-23 DIAGNOSIS — D2261 Melanocytic nevi of right upper limb, including shoulder: Secondary | ICD-10-CM | POA: Diagnosis not present

## 2020-07-23 DIAGNOSIS — L821 Other seborrheic keratosis: Secondary | ICD-10-CM | POA: Diagnosis not present

## 2020-08-05 ENCOUNTER — Other Ambulatory Visit: Payer: Self-pay | Admitting: Physician Assistant

## 2020-08-05 DIAGNOSIS — F3341 Major depressive disorder, recurrent, in partial remission: Secondary | ICD-10-CM

## 2020-08-07 ENCOUNTER — Other Ambulatory Visit: Payer: Self-pay

## 2020-08-07 ENCOUNTER — Ambulatory Visit
Admission: RE | Admit: 2020-08-07 | Discharge: 2020-08-07 | Disposition: A | Discharge status: Home / Self Care | Payer: BC Managed Care – PPO | Source: Ambulatory Visit | Attending: Physician Assistant | Admitting: Physician Assistant

## 2020-08-07 DIAGNOSIS — Z1239 Encounter for other screening for malignant neoplasm of breast: Secondary | ICD-10-CM

## 2020-08-07 DIAGNOSIS — Z1231 Encounter for screening mammogram for malignant neoplasm of breast: Secondary | ICD-10-CM | POA: Insufficient documentation

## 2020-08-27 ENCOUNTER — Encounter: Payer: Self-pay | Admitting: Physician Assistant

## 2020-09-06 IMAGING — CR LEFT KNEE - COMPLETE 4+ VIEW
1 series · 7 of 7 positions shown · non-contrast
Comparison: None.

CLINICAL DATA: Posterior left knee pain for 2 years. No known
injury.

EXAM:
LEFT KNEE - COMPLETE 4+ VIEW

[Series 1: dg knee complete 4 views left · 0.14mm/px · 7 of 7 slices shown]
[im 1/7]
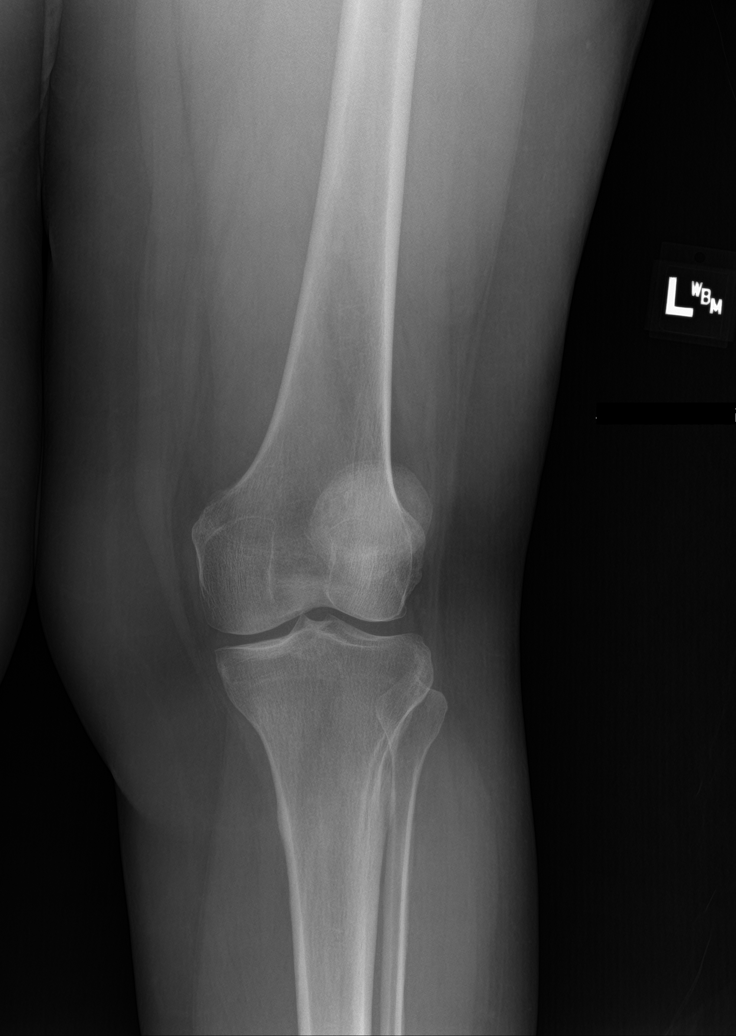
[im 2/7]
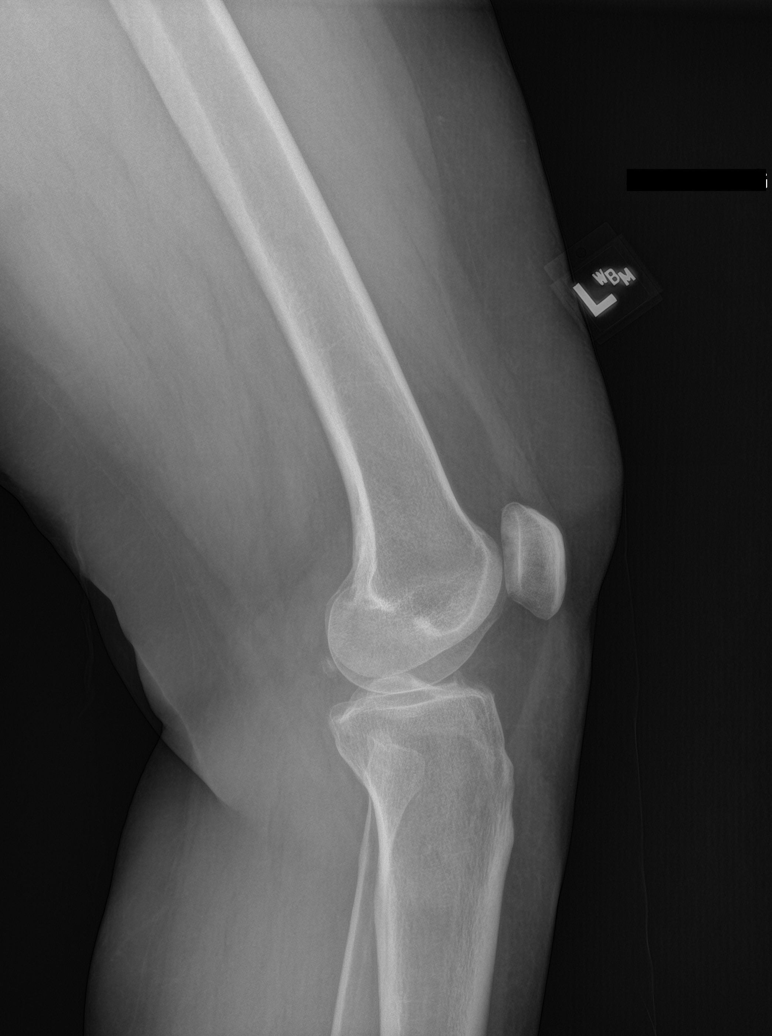
[im 3/7]
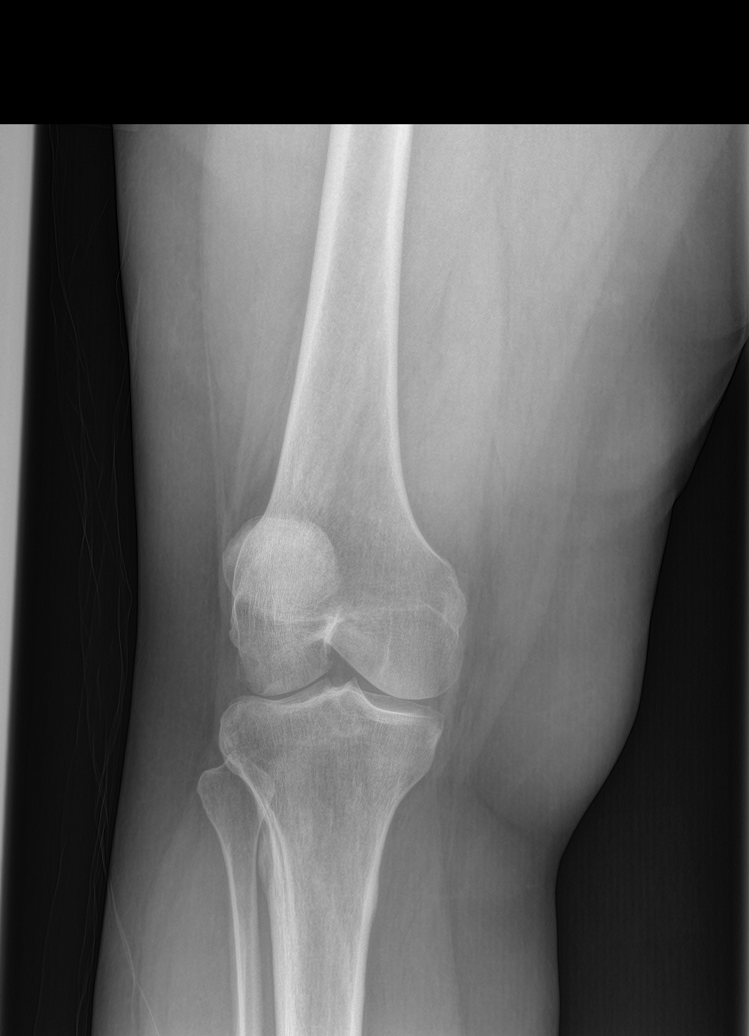
[im 4/7]
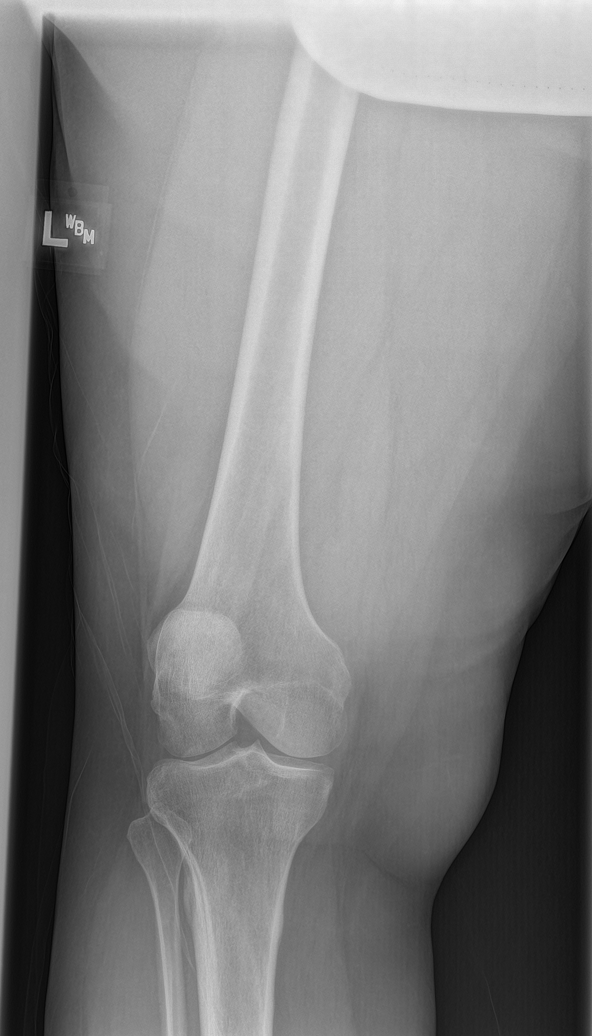
[im 5/7]
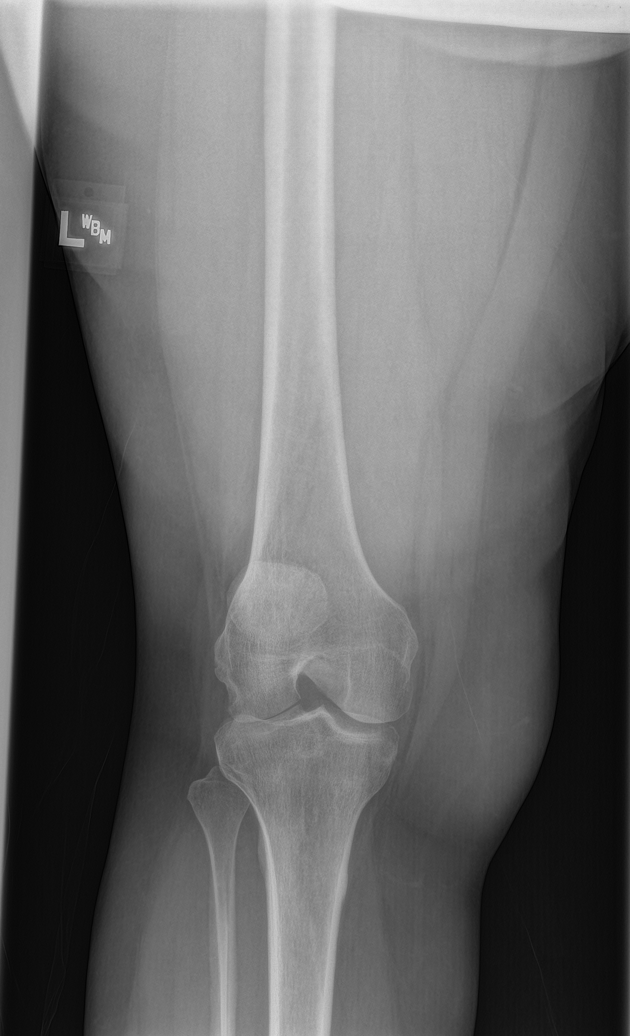
[im 6/7]
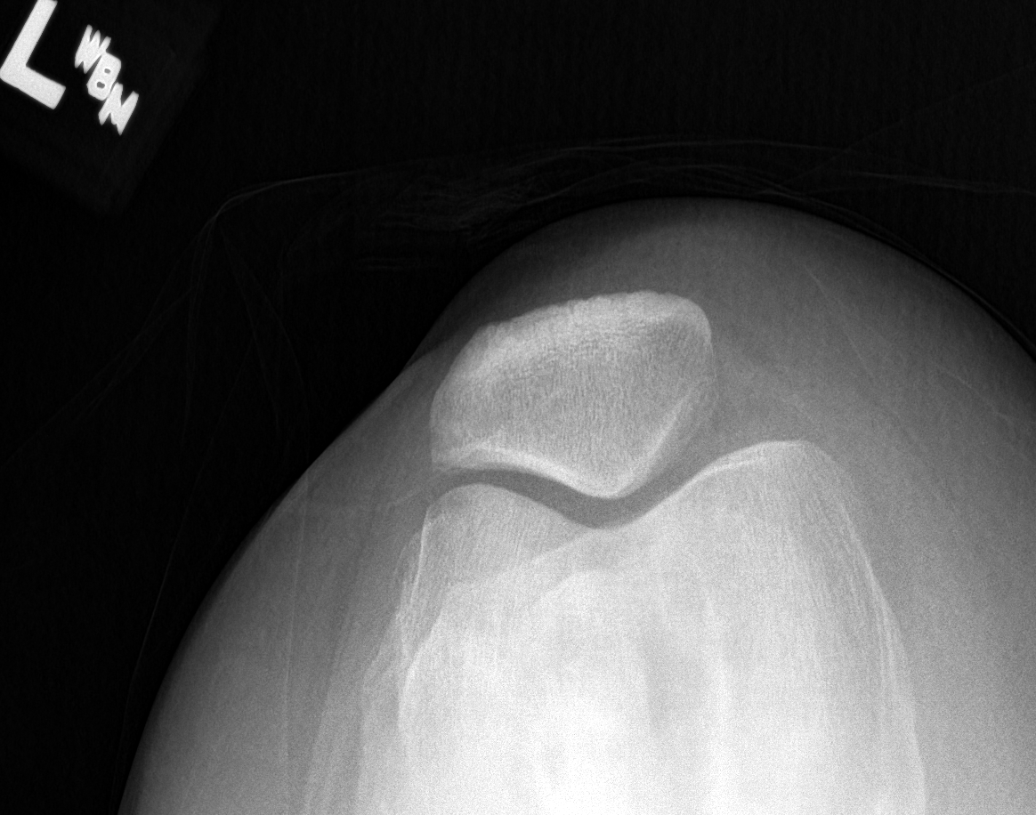
[im 7/7]
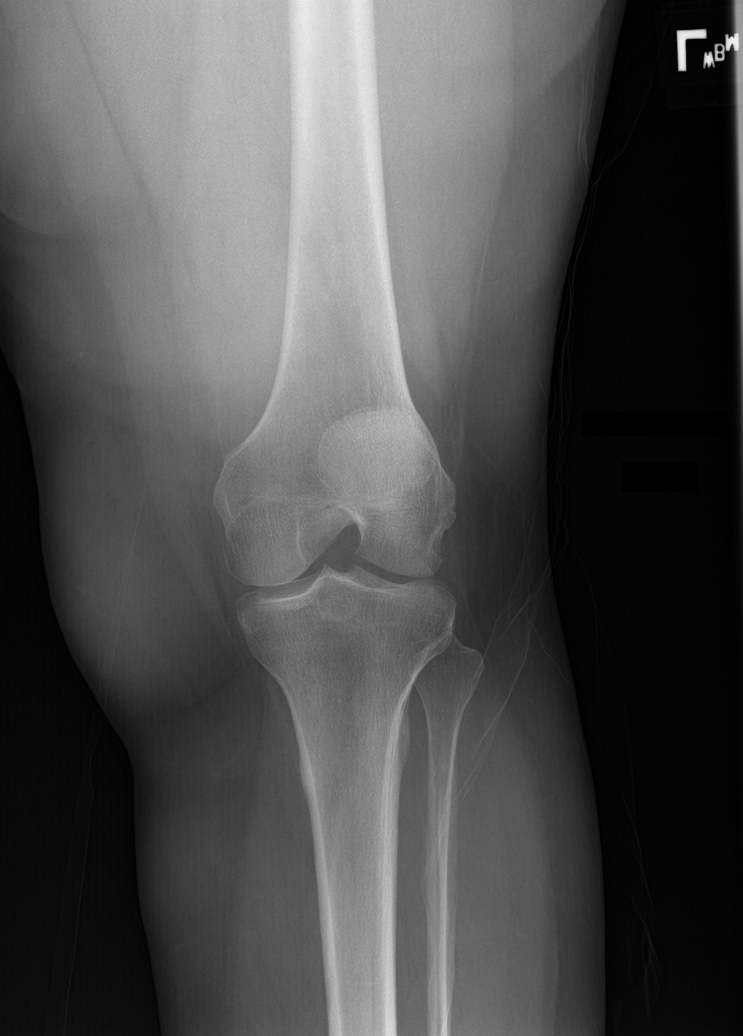

[7 of 7 positions shown; findings below may reference images not displayed]

FINDINGS: No evidence of fracture, dislocation, or joint effusion. No evidence
of arthropathy or other focal bone abnormality. Soft tissues are
unremarkable.
IMPRESSION: Normal exam.

## 2020-09-11 ENCOUNTER — Ambulatory Visit (INDEPENDENT_AMBULATORY_CARE_PROVIDER_SITE_OTHER): Payer: BC Managed Care – PPO | Admitting: Family Medicine

## 2020-09-11 ENCOUNTER — Other Ambulatory Visit: Payer: Self-pay

## 2020-09-11 DIAGNOSIS — Z23 Encounter for immunization: Secondary | ICD-10-CM | POA: Diagnosis not present

## 2020-11-06 ENCOUNTER — Other Ambulatory Visit: Payer: Self-pay | Admitting: Physician Assistant

## 2020-11-06 DIAGNOSIS — N951 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: Secondary | ICD-10-CM

## 2020-11-06 DIAGNOSIS — I10 Essential (primary) hypertension: Secondary | ICD-10-CM

## 2020-11-15 ENCOUNTER — Other Ambulatory Visit: Payer: Self-pay | Admitting: Physician Assistant

## 2020-11-15 DIAGNOSIS — I10 Essential (primary) hypertension: Secondary | ICD-10-CM

## 2020-11-15 DIAGNOSIS — N951 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: Secondary | ICD-10-CM

## 2020-12-28 ENCOUNTER — Other Ambulatory Visit: Payer: Self-pay | Admitting: *Deleted

## 2020-12-28 ENCOUNTER — Other Ambulatory Visit: Payer: Self-pay | Admitting: Family Medicine

## 2020-12-28 DIAGNOSIS — F3341 Major depressive disorder, recurrent, in partial remission: Secondary | ICD-10-CM

## 2020-12-28 MED ORDER — ESCITALOPRAM OXALATE 10 MG PO TABS: 10.0000 mg | ORAL_TABLET | Freq: Every day | ORAL | 0 refills | Status: DC

## 2020-12-28 NOTE — Telephone Encounter (Signed)
Request change pharmacies. Approval not allowed

## 2020-12-28 NOTE — Telephone Encounter (Signed)
Courtesy refill . Future visit in 3 months . Request to change primary pharmacy to [facility] [phone]

## 2020-12-28 NOTE — Telephone Encounter (Signed)
Courtesy refill. Future visit in 3 months .

## 2021-02-05 ENCOUNTER — Ambulatory Visit: Payer: Self-pay | Admitting: Family Medicine

## 2021-02-17 ENCOUNTER — Telehealth: Payer: Self-pay | Admitting: Physician Assistant

## 2021-02-17 DIAGNOSIS — N951 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: Secondary | ICD-10-CM

## 2021-02-17 DIAGNOSIS — I10 Essential (primary) hypertension: Secondary | ICD-10-CM

## 2021-02-17 MED ORDER — TRIAMTERENE-HCTZ 37.5-25 MG PO TABS: 1.0000 | ORAL_TABLET | Freq: Every day | ORAL | 1 refills | Status: DC

## 2021-02-17 MED ORDER — NORETHINDRONE ACETATE 5 MG PO TABS: 5.0000 mg | ORAL_TABLET | Freq: Every day | ORAL | 1 refills | Status: DC

## 2021-02-17 NOTE — Telephone Encounter (Signed)
[place] faxed refill request for the following medications:   triamterene-hydrochlorothiazide ([name]-25) 37.5-25 MG tablet   norethindrone (AYGESTIN) 5 MG tablet   Please advise.

## 2021-03-19 ENCOUNTER — Other Ambulatory Visit: Payer: Self-pay

## 2021-03-19 ENCOUNTER — Ambulatory Visit: Payer: BC Managed Care – PPO | Admitting: Family Medicine

## 2021-03-19 ENCOUNTER — Encounter: Payer: Self-pay | Admitting: Family Medicine

## 2021-03-19 VITALS — BP 114/66 | HR 68 | Temp 97.6°F | Resp 16 | Ht 64.0 in | Wt 220.0 lb

## 2021-03-19 DIAGNOSIS — F3341 Major depressive disorder, recurrent, in partial remission: Secondary | ICD-10-CM

## 2021-03-19 DIAGNOSIS — I10 Essential (primary) hypertension: Secondary | ICD-10-CM | POA: Diagnosis not present

## 2021-03-19 DIAGNOSIS — K219 Gastro-esophageal reflux disease without esophagitis: Secondary | ICD-10-CM

## 2021-03-19 MED ORDER — ESCITALOPRAM OXALATE 10 MG PO TABS: 10.0000 mg | ORAL_TABLET | Freq: Every day | ORAL | 1 refills | Status: DC

## 2021-03-19 NOTE — Assessment & Plan Note (Signed)
Well controlled Elevated creatinine on last BMP Continue [name] Recheck metabolic panel

## 2021-03-19 NOTE — Patient Instructions (Signed)
[email or web address]

## 2021-03-19 NOTE — Assessment & Plan Note (Signed)
Well controlled Continue famotidine

## 2021-03-19 NOTE — Progress Notes (Signed)
Established patient visit   Patient: Tracy Booth   DOB: 06/28/1970   50 y.o. Female  MRN: [id number] Visit Date: 03/19/2021  Today's healthcare provider: Lavon Paganini, MD   Chief Complaint  Patient presents with   Follow-up    Subjective     Tolerating all medications well Taking famotidine daily with occasional breakthrough symptoms 2-3x a month  Medications: Outpatient Medications Prior to Visit  Medication Sig   cetirizine (ZYRTEC) 10 MG tablet Take 10 mg by mouth daily as needed for allergies.   famotidine (PEPCID) 10 MG tablet Take 10 mg by mouth daily as needed for heartburn or indigestion.   norethindrone (AYGESTIN) 5 MG tablet Take 1 tablet (5 mg total) by mouth at bedtime.   Probiotic CAPS Take 2 capsules by mouth at bedtime.   triamterene-hydrochlorothiazide ([name]-25) 37.5-25 MG tablet Take 1 tablet by mouth daily.   [DISCONTINUED] escitalopram (LEXAPRO) 10 MG tablet Take 1 tablet (10 mg total) by mouth at bedtime.   No facility-administered medications prior to visit.    Review of Systems  Constitutional: Negative.   HENT: Negative.    Eyes: Negative.   Respiratory: Negative.    Cardiovascular: Negative.   Gastrointestinal: Negative.   Endocrine: Positive for cold intolerance.  Genitourinary: Negative.   Musculoskeletal:  Positive for gait problem.  Skin: Negative.   Allergic/Immunologic: Positive for environmental allergies.  Psychiatric/Behavioral: Negative.        Objective    BP 114/66 (BP Location: Right Arm, Patient Position: Sitting, Cuff Size: Large)   Pulse 68   Temp 97.6 F (36.4 C) (Temporal)   Resp 16   Ht '5\' 4"'  (1.626 m)   Wt 220 lb (99.8 kg)   SpO2 98%   BMI 37.76 kg/m  {Show previous vital signs (optional):23777}  Physical Exam Vitals reviewed.  Constitutional:      General: She is not in acute distress.    Appearance: Normal appearance. She is not ill-appearing or toxic-appearing.  HENT:     Head:  Normocephalic and atraumatic.     Right Ear: External ear normal.     Left Ear: External ear normal.     Nose: Nose normal.     Mouth/Throat:     Mouth: Mucous membranes are moist.     Pharynx: Oropharynx is clear. No oropharyngeal exudate or posterior oropharyngeal erythema.  Eyes:     General: No scleral icterus.    Extraocular Movements: Extraocular movements intact.     Conjunctiva/sclera: Conjunctivae normal.     Pupils: Pupils are equal, round, and reactive to light.  Cardiovascular:     Rate and Rhythm: Normal rate and regular rhythm.     Pulses: Normal pulses.     Heart sounds: Normal heart sounds. No murmur heard.   No friction rub. No gallop.  Pulmonary:     Effort: Pulmonary effort is normal. No respiratory distress.     Breath sounds: Normal breath sounds. No wheezing or rhonchi.  Chest:     Chest wall: No tenderness.  Abdominal:     General: Abdomen is flat. There is no distension.     Palpations: Abdomen is soft.     Tenderness: There is no abdominal tenderness.  Musculoskeletal:        General: Normal range of motion.     Cervical back: Normal range of motion and neck supple.     Right lower leg: No edema.     Left lower leg: No edema.  Skin:    General: Skin is warm and dry.     Capillary Refill: Capillary refill takes less than 2 seconds.     Findings: No lesion or rash.  Neurological:     General: No focal deficit present.     Mental Status: She is alert and oriented to person, place, and time. Mental status is at baseline.  Psychiatric:        Mood and Affect: Mood normal.      Results for orders placed or performed in visit on 98/33/82  Basic Metabolic Panel (BMET)  Result Value Ref Range   Glucose 88 70 - 99 mg/dL   BUN 15 6 - 24 mg/dL   Creatinine, Ser 1.32 (H) 0.57 - 1.00 mg/dL   eGFR 49 (L) >59 mL/min/1.73   BUN/Creatinine Ratio 11 9 - 23   Sodium 140 134 - 144 mmol/L   Potassium 4.2 3.5 - 5.2 mmol/L   Chloride 100 96 - 106 mmol/L   CO2 25  20 - 29 mmol/L   Calcium 9.3 8.7 - 10.2 mg/dL    Assessment & Plan     Problem List Items Addressed This Visit       Cardiovascular and Mediastinum   Essential hypertension - Primary    Well controlled Elevated creatinine on last BMP Continue [name] Recheck metabolic panel      Relevant Orders   Basic Metabolic Panel (BMET) (Completed)     Digestive   Esophageal reflux    Well controlled Continue famotidine        Other   Recurrent major depressive disorder, in partial remission ([place])    Well controlled Continue Lexapro 20m      Relevant Medications   escitalopram (LEXAPRO) 10 MG tablet   Morbid obesity ([place]    Discussed importance of healthy weight management Discussed diet and exercise        Return in about 6 months (around 09/17/2021) for CPE, With new PCP.      ENelva Nay Medical Student 03/22/2021, 8:00 AM    Patient seen along with MS3 student ENelva Nay I personally evaluated this patient along with the student, and verified all aspects of the history, physical exam, and medical decision making as documented by the student. I agree with the student's documentation and have made all necessary edits.  Karlyn Glasco, ADionne Bucy MD, MPH [facility]

## 2021-03-19 NOTE — Assessment & Plan Note (Signed)
Well controlled Continue Lexapro 10mg 

## 2021-03-19 NOTE — Assessment & Plan Note (Signed)
Discussed importance of healthy weight management Discussed diet and exercise  

## 2021-03-20 LAB — BASIC METABOLIC PANEL
BUN/Creatinine Ratio: 11 (ref 9–23)
BUN: 15 mg/dL (ref 6–24)
CO2: 25 mmol/L (ref 20–29)
Calcium: 9.3 mg/dL (ref 8.7–10.2)
Chloride: 100 mmol/L (ref 96–106)
Creatinine, Ser: 1.32 mg/dL — ABNORMAL HIGH (ref 0.57–1.00)
Glucose: 88 mg/dL (ref 70–99)
Potassium: 4.2 mmol/L (ref 3.5–5.2)
Sodium: 140 mmol/L (ref 134–144)
eGFR: 49 mL/min/{1.73_m2} — ABNORMAL LOW (ref >59)

## 2021-03-22 ENCOUNTER — Encounter: Payer: Self-pay | Admitting: Family Medicine

## 2021-04-01 ENCOUNTER — Ambulatory Visit: Payer: BC Managed Care – PPO | Admitting: Family Medicine

## 2021-07-06 ENCOUNTER — Other Ambulatory Visit: Payer: Self-pay | Admitting: Physician Assistant

## 2021-07-06 DIAGNOSIS — F3341 Major depressive disorder, recurrent, in partial remission: Secondary | ICD-10-CM

## 2021-07-28 ENCOUNTER — Other Ambulatory Visit: Payer: Self-pay | Admitting: Family Medicine

## 2021-07-28 DIAGNOSIS — D2261 Melanocytic nevi of right upper limb, including shoulder: Secondary | ICD-10-CM | POA: Diagnosis not present

## 2021-07-28 DIAGNOSIS — D2271 Melanocytic nevi of right lower limb, including hip: Secondary | ICD-10-CM | POA: Diagnosis not present

## 2021-07-28 DIAGNOSIS — Z1231 Encounter for screening mammogram for malignant neoplasm of breast: Secondary | ICD-10-CM

## 2021-07-28 DIAGNOSIS — D225 Melanocytic nevi of trunk: Secondary | ICD-10-CM | POA: Diagnosis not present

## 2021-07-28 DIAGNOSIS — D2262 Melanocytic nevi of left upper limb, including shoulder: Secondary | ICD-10-CM | POA: Diagnosis not present

## 2021-09-15 NOTE — Progress Notes (Unsigned)
Complete physical exam   Patient: Tracy Booth   DOB: 06/18/1970   51 y.o. Female  MRN: [id number] Visit Date: 09/17/2021  Today's healthcare provider: Gwyneth Sprout, FNP   No chief complaint on file.  Subjective    Tracy Booth is a 51 y.o. female who presents today for a complete physical exam.  She reports consuming a {diet types:17450} diet. {Exercise:19826} She generally feels {well/fairly well/poorly:18703}. She reports sleeping {well/fairly well/poorly:18703}. She {does/does not:200015} have additional problems to discuss today.  HPI  ***  Past Medical History:  Diagnosis Date   Complication of anesthesia    Dizziness    occas   Heart murmur    History of infertility    Obesity    PONV (postoperative nausea and vomiting)    Seasonal allergies    Spontaneous pneumothorax    Past Surgical History:  Procedure Laterality Date   ABLATION ON ENDOMETRIOSIS  2012   COLONOSCOPY WITH PROPOFOL N/A 12/07/2018   Procedure: COLONOSCOPY WITH PROPOFOL;  Surgeon: Jonathon Bellows, MD;  Location: [facility] ENDOSCOPY;  Service: Gastroenterology;  Laterality: N/A;   LUNG SURGERY Left 1997 and [place] Left 08/31/2017   Procedure: ULNAR COLLATERAL LIGAMENT REPAIR- LEFT THUMB;  Surgeon: Corky Mull, MD;  Location: [facility] ORS;  Service: Orthopedics;  Laterality: Left;   Social History   Socioeconomic History   Marital status: Married    Spouse name: Not on file   Number of children: Not on file   Years of education: Not on file   Highest education level: Not on file  Occupational History   Not on file  Tobacco Use   Smoking status: Never   Smokeless tobacco: Never  Vaping Use   Vaping Use: Never used  Substance and Sexual Activity   Alcohol use: Yes    Comment: rare   Drug use: Never   Sexual activity: Not on file  Other Topics Concern   Not on file  Social History Narrative   Not on file   Social Determinants of  Health   Financial Resource Strain: Not on file  Food Insecurity: Not on file  Transportation Needs: Not on file  Physical Activity: Not on file  Stress: Not on file  Social Connections: Not on file  Intimate Partner Violence: Not on file   Family Status  Relation Name Status   Mother  Alive   Father  Alive   MGM  Deceased   Brother  Alive   Son  Alive   Family History  Problem Relation Age of Onset   Hypertension Mother    Arthritis Mother    Fibromyalgia Mother    Healthy Father    Breast cancer Maternal Grandmother    Ovarian cancer Maternal Grandmother    Healthy Brother    No Known Allergies  Patient Care Team: Tally Joe T, FNP as PCP - General (Family Medicine)   Medications: Outpatient Medications Prior to Visit  Medication Sig   cetirizine (ZYRTEC) 10 MG tablet Take 10 mg by mouth daily as needed for allergies.   escitalopram (LEXAPRO) 10 MG tablet TAKE 1 TABLET(10 MG) BY MOUTH AT BEDTIME   famotidine (PEPCID) 10 MG tablet Take 10 mg by mouth daily as needed for heartburn or indigestion.   norethindrone (AYGESTIN) 5 MG tablet Take 1 tablet (5 mg total) by mouth at bedtime.   Probiotic CAPS Take 2 capsules by mouth at bedtime.  triamterene-hydrochlorothiazide ([name]-25) 37.5-25 MG tablet Take 1 tablet by mouth daily.   No facility-administered medications prior to visit.    Review of Systems  {Labs  Heme  Chem  Endocrine  Serology  Results Review (optional):23779}  Objective    There were no vitals taken for this visit. {Show previous vital signs (optional):23777}   Physical Exam  ***  Last depression screening scores    03/19/2021    8:37 AM 06/09/2020    5:02 PM 08/16/2019    4:43 PM  PHQ 2/9 Scores  PHQ - 2 Score 0 2 1  PHQ- 9 Score '3 5 6   '$ Last fall risk screening    03/19/2021    8:38 AM  [place] in the past year? 0  Number falls in past yr: 0  Injury with Fall? 0  Risk for fall due to : No Fall Risks  Follow up  Falls evaluation completed   Last Audit-C alcohol use screening    03/19/2021    8:37 AM  Alcohol Use Disorder Test (AUDIT)  1. How often do you have a drink containing alcohol? 1  2. How many drinks containing alcohol do you have on a typical day when you are drinking? 0  3. How often do you have six or more drinks on one occasion? 0  AUDIT-C Score 1   A score of 3 or more in women, and 4 or more in men indicates increased risk for alcohol abuse, EXCEPT if all of the points are from question 1   No results found for any visits on 09/17/21.  Assessment & Plan    Routine Health Maintenance and Physical Exam  Exercise Activities and Dietary recommendations  Goals   None     Immunization History  Administered Date(s) Administered   PFIZER(Purple Top)SARS-COV-2 Vaccination 08/03/2019, 08/28/2019, 05/02/2020   Tdap 11/25/2012   Zoster Recombinat (Shingrix) 06/09/2020, 09/11/2020    Health Maintenance  Topic Date Due   COVID-19 Vaccine (4 - Booster for Pfizer series) 06/27/2020   INFLUENZA VACCINE  11/16/2021   COLONOSCOPY (Pts 45-66yr Insurance coverage will need to be confirmed)  01/29/2022   MAMMOGRAM  08/08/2022   TETANUS/TDAP  11/26/2022   PAP SMEAR-Modifier  08/24/2023   Hepatitis C Screening  Completed   HIV Screening  Completed   Zoster Vaccines- Shingrix  Completed   HPV VACCINES  Aged Out   Fecal DNA (Cologuard)  Discontinued    Discussed health benefits of physical activity, and encouraged her to engage in regular exercise appropriate for her age and condition.  ***  No follow-ups on file.     {provider attestation***:1}   EGwyneth Sprout [phone](phone) [phone](fax)  [place]

## 2021-09-17 ENCOUNTER — Encounter: Payer: Self-pay | Admitting: Family Medicine

## 2021-09-17 ENCOUNTER — Other Ambulatory Visit (HOSPITAL_COMMUNITY)
Admission: RE | Admit: 2021-09-17 | Discharge: 2021-09-17 | Disposition: A | Discharge status: Home / Self Care | Payer: BC Managed Care – PPO | Source: Ambulatory Visit | Attending: Family Medicine | Admitting: Family Medicine

## 2021-09-17 ENCOUNTER — Ambulatory Visit (INDEPENDENT_AMBULATORY_CARE_PROVIDER_SITE_OTHER): Payer: BC Managed Care – PPO | Admitting: Family Medicine

## 2021-09-17 VITALS — BP 112/75 | HR 80 | Temp 98.6°F | Resp 16 | Ht 64.0 in | Wt 219.0 lb

## 2021-09-17 DIAGNOSIS — K635 Polyp of colon: Secondary | ICD-10-CM

## 2021-09-17 DIAGNOSIS — I10 Essential (primary) hypertension: Secondary | ICD-10-CM

## 2021-09-17 DIAGNOSIS — F3341 Major depressive disorder, recurrent, in partial remission: Secondary | ICD-10-CM

## 2021-09-17 DIAGNOSIS — R7989 Other specified abnormal findings of blood chemistry: Secondary | ICD-10-CM | POA: Insufficient documentation

## 2021-09-17 DIAGNOSIS — K219 Gastro-esophageal reflux disease without esophagitis: Secondary | ICD-10-CM

## 2021-09-17 DIAGNOSIS — Z124 Encounter for screening for malignant neoplasm of cervix: Secondary | ICD-10-CM | POA: Insufficient documentation

## 2021-09-17 DIAGNOSIS — Z1231 Encounter for screening mammogram for malignant neoplasm of breast: Secondary | ICD-10-CM | POA: Insufficient documentation

## 2021-09-17 DIAGNOSIS — Z0000 Encounter for general adult medical examination without abnormal findings: Secondary | ICD-10-CM | POA: Diagnosis not present

## 2021-09-17 DIAGNOSIS — N1831 Chronic kidney disease, stage 3a: Secondary | ICD-10-CM | POA: Diagnosis not present

## 2021-09-17 NOTE — Assessment & Plan Note (Signed)
Chronic, stable Body mass index is 37.59 kg/m. Discussed importance of healthy weight management Discussed diet and exercise Hx of HTN, Depression, GERD

## 2021-09-17 NOTE — Assessment & Plan Note (Signed)
Chronic, stable PHQ 9 reviewed Continue Lexapro 10 mg daily

## 2021-09-17 NOTE — Assessment & Plan Note (Signed)
Due for cervical cancer screening; last completed 3 years ago without transformation zone Denies complaints

## 2021-09-17 NOTE — Assessment & Plan Note (Signed)
Chronic, previously stable for 9 months Repeat CMP Recommend referral to nephrology for expert opinion

## 2021-09-17 NOTE — Assessment & Plan Note (Signed)
Noted on previous colon cancer screening in 2020; due for 3 year follow up Denies stool changes Referral placed Hx of positive cologuard

## 2021-09-17 NOTE — Assessment & Plan Note (Signed)
Married, 53 year old son, son plays soccer, her parents are selling house in [place] and moving in with her Has mammo scheduled PAP completed today Will refer for repeat colonoscopy UTD on dental UTD on vision Will repeat annual labs; recommend nephrology consults if eGFR remains low and/or creatinine remains elevated  Things to do to keep yourself healthy  - Exercise at least 30-45 minutes a day, 3-4 days a week.  - Eat a low-fat diet with lots of fruits and vegetables, up to 7-9 servings per day.  - Seatbelts can save your life. Wear them always.  - Smoke detectors on every level of your home, check batteries every year.  - Eye Doctor - have an eye exam every 1-2 years  - Safe sex - if you may be exposed to STDs, use a condom.  - Alcohol -  If you drink, do it moderately, less than 2 drinks per day.  - [place]. Choose someone to speak for you if you are not able.  - Depression is common in our stressful world.If you're feeling down or losing interest in things you normally enjoy, please come in for a visit.  - Violence - If anyone is threatening or hurting you, please call immediately.

## 2021-09-17 NOTE — Assessment & Plan Note (Signed)
Chronic, stable Goal <140/<90 Denies CP Denies SOB/ DOE Denies low blood pressure/hypotension Denies vision changes No LE Edema noted on exam Continue medication, [name] 37.5-25 RTC annually for CPE Seek emergent care if you develop chest pain or chest pressure

## 2021-09-17 NOTE — Assessment & Plan Note (Signed)
Chronic, stable Continue 10 mg pepcid

## 2021-09-17 NOTE — Assessment & Plan Note (Signed)
Chronic, stable Is working on hydration status Continue to monitor CMP

## 2021-09-18 LAB — CBC WITH DIFFERENTIAL/PLATELET
Basophils Absolute: 0.1 10*3/uL (ref 0.0–0.2)
Basos: 1 %
EOS (ABSOLUTE): 0.1 10*3/uL (ref 0.0–0.4)
Eos: 1 %
Hematocrit: 38 % (ref 34.0–46.6)
Hemoglobin: 13.4 g/dL (ref 11.1–15.9)
Immature Grans (Abs): 0 10*3/uL (ref 0.0–0.1)
Immature Granulocytes: 0 %
Lymphocytes Absolute: 2 10*3/uL (ref 0.7–3.1)
Lymphs: 28 %
MCH: 31.4 pg (ref 26.6–33.0)
MCHC: 35.3 g/dL (ref 31.5–35.7)
MCV: 89 fL (ref 79–97)
Monocytes Absolute: 0.5 10*3/uL (ref 0.1–0.9)
Monocytes: 7 %
Neutrophils Absolute: 4.7 10*3/uL (ref 1.4–7.0)
Neutrophils: 63 %
Platelets: 361 10*3/uL (ref 150–450)
RBC: 4.27 x10E6/uL (ref 3.77–5.28)
RDW: 12.4 % (ref 11.7–15.4)
WBC: 7.3 10*3/uL (ref 3.4–10.8)

## 2021-09-18 LAB — T4, FREE: Free T4: 1.49 ng/dL (ref 0.82–1.77)

## 2021-09-18 LAB — LIPID PANEL
Chol/HDL Ratio: 4.4 ratio (ref 0.0–4.4)
Cholesterol, Total: 223 mg/dL — ABNORMAL HIGH (ref 100–199)
HDL: 51 mg/dL (ref >39)
LDL Chol Calc (NIH): 161 mg/dL — ABNORMAL HIGH (ref 0–99)
Triglycerides: 64 mg/dL (ref 0–149)
VLDL Cholesterol Cal: 11 mg/dL (ref 5–40)

## 2021-09-18 LAB — COMPREHENSIVE METABOLIC PANEL
ALT: 20 IU/L (ref 0–32)
AST: 23 IU/L (ref 0–40)
Albumin/Globulin Ratio: 1.8 (ref 1.2–2.2)
Albumin: 4.3 g/dL (ref 3.8–4.9)
Alkaline Phosphatase: 70 IU/L (ref 44–121)
BUN/Creatinine Ratio: 13 (ref 9–23)
BUN: 15 mg/dL (ref 6–24)
Bilirubin Total: 0.5 mg/dL (ref 0.0–1.2)
CO2: 23 mmol/L (ref 20–29)
Calcium: 9 mg/dL (ref 8.7–10.2)
Chloride: 100 mmol/L (ref 96–106)
Creatinine, Ser: 1.14 mg/dL — ABNORMAL HIGH (ref 0.57–1.00)
Globulin, Total: 2.4 g/dL (ref 1.5–4.5)
Glucose: 86 mg/dL (ref 70–99)
Potassium: 3.7 mmol/L (ref 3.5–5.2)
Sodium: 138 mmol/L (ref 134–144)
Total Protein: 6.7 g/dL (ref 6.0–8.5)
eGFR: 58 mL/min/{1.73_m2} — ABNORMAL LOW (ref >59)

## 2021-09-18 LAB — TSH: TSH: 1.72 u[IU]/mL (ref 0.450–4.500)

## 2021-09-18 LAB — HEMOGLOBIN A1C
Est. average glucose Bld gHb Est-mCnc: 111 mg/dL
Hgb A1c MFr Bld: 5.5 % (ref 4.8–5.6)

## 2021-09-20 ENCOUNTER — Other Ambulatory Visit: Payer: Self-pay

## 2021-09-20 ENCOUNTER — Telehealth: Payer: Self-pay

## 2021-09-20 DIAGNOSIS — K635 Polyp of colon: Secondary | ICD-10-CM

## 2021-09-20 MED ORDER — NA SULFATE-K SULFATE-MG SULF 17.5-3.13-1.6 GM/177ML PO SOLN: 1.0000 | Freq: Once | ORAL | 0 refills | Status: AC

## 2021-09-20 NOTE — Telephone Encounter (Signed)
Gastroenterology Pre-Procedure Review  Request Date: 12/10/21 Requesting Physician: Dr. Vicente Males  PATIENT REVIEW QUESTIONS: The patient responded to the following health history questions as indicated:    1. Are you having any GI issues? no 2. Do you have a personal history of Polyps? Yes last colonoscopy performed by Dr. Vicente Males 12/07/18 3. Do you have a family history of Colon Cancer or Polyps? no 4. Diabetes Mellitus? no 5. Joint replacements in the past 12 months?no 6. Major health problems in the past 3 months?no 7. Any artificial heart valves, MVP, or defibrillator?no    MEDICATIONS & ALLERGIES:    Patient reports the following regarding taking any anticoagulation/antiplatelet therapy:   Plavix, Coumadin, Eliquis, Xarelto, Lovenox, Pradaxa, Brilinta, or Effient? no Aspirin? no  Patient confirms/reports the following medications:  Current Outpatient Medications  Medication Sig Dispense Refill   cetirizine (ZYRTEC) 10 MG tablet Take 10 mg by mouth daily as needed for allergies.     escitalopram (LEXAPRO) 10 MG tablet TAKE 1 TABLET(10 MG) BY MOUTH AT BEDTIME 90 tablet 1   famotidine (PEPCID) 10 MG tablet Take 10 mg by mouth daily as needed for heartburn or indigestion.     norethindrone (AYGESTIN) 5 MG tablet Take 1 tablet (5 mg total) by mouth at bedtime. 90 tablet 1   Probiotic CAPS Take 2 capsules by mouth at bedtime.     triamterene-hydrochlorothiazide ([name]-25) 37.5-25 MG tablet Take 1 tablet by mouth daily. 90 tablet 1   No current facility-administered medications for this visit.    Patient confirms/reports the following allergies:  No Known Allergies  No orders of the defined types were placed in this encounter.   AUTHORIZATION INFORMATION Primary Insurance: 1D#: Group #:  Secondary Insurance: 1D#: Group #:  SCHEDULE INFORMATION: Date: 12/10/21 Time: Location: [facility]

## 2021-09-21 ENCOUNTER — Other Ambulatory Visit: Payer: Self-pay | Admitting: Family Medicine

## 2021-09-21 ENCOUNTER — Encounter: Payer: Self-pay | Admitting: Family Medicine

## 2021-09-21 DIAGNOSIS — R7989 Other specified abnormal findings of blood chemistry: Secondary | ICD-10-CM

## 2021-09-21 DIAGNOSIS — N1831 Chronic kidney disease, stage 3a: Secondary | ICD-10-CM

## 2021-09-23 LAB — CYTOLOGY - PAP
Adequacy: ABSENT
Chlamydia: NEGATIVE
Comment: NEGATIVE
Comment: NEGATIVE
Comment: NEGATIVE
Comment: NEGATIVE
Comment: NORMAL
Diagnosis: NEGATIVE
HSV1: NEGATIVE
HSV2: NEGATIVE
High risk HPV: NEGATIVE
Neisseria Gonorrhea: NEGATIVE
Trichomonas: NEGATIVE

## 2021-10-01 ENCOUNTER — Ambulatory Visit
Admission: RE | Admit: 2021-10-01 | Discharge: 2021-10-01 | Disposition: A | Discharge status: Home / Self Care | Payer: BC Managed Care – PPO | Source: Ambulatory Visit | Attending: Family Medicine | Admitting: Family Medicine

## 2021-10-01 DIAGNOSIS — Z1231 Encounter for screening mammogram for malignant neoplasm of breast: Secondary | ICD-10-CM | POA: Diagnosis not present

## 2021-10-05 NOTE — Progress Notes (Signed)
Hi Ruthann  Normal mammogram; repeat in 1 year.  Please let us know if you have any questions.  Thank you,  Tally Joe, FNP

## 2021-10-13 DIAGNOSIS — N1831 Chronic kidney disease, stage 3a: Secondary | ICD-10-CM | POA: Diagnosis not present

## 2021-10-13 DIAGNOSIS — F32A Depression, unspecified: Secondary | ICD-10-CM | POA: Diagnosis not present

## 2021-10-13 DIAGNOSIS — R82998 Other abnormal findings in urine: Secondary | ICD-10-CM | POA: Diagnosis not present

## 2021-10-13 DIAGNOSIS — I10 Essential (primary) hypertension: Secondary | ICD-10-CM | POA: Diagnosis not present

## 2021-10-14 ENCOUNTER — Other Ambulatory Visit: Payer: Self-pay | Admitting: Nephrology

## 2021-10-14 DIAGNOSIS — E668 Other obesity: Secondary | ICD-10-CM

## 2021-10-14 DIAGNOSIS — N1831 Chronic kidney disease, stage 3a: Secondary | ICD-10-CM

## 2021-10-14 DIAGNOSIS — R82998 Other abnormal findings in urine: Secondary | ICD-10-CM

## 2021-10-14 DIAGNOSIS — I10 Essential (primary) hypertension: Secondary | ICD-10-CM

## 2021-10-14 DIAGNOSIS — E6689 Other obesity not elsewhere classified: Secondary | ICD-10-CM

## 2021-10-29 ENCOUNTER — Ambulatory Visit
Admission: RE | Admit: 2021-10-29 | Discharge: 2021-10-29 | Disposition: A | Discharge status: Home / Self Care | Payer: BC Managed Care – PPO | Source: Ambulatory Visit | Attending: Nephrology | Admitting: Nephrology

## 2021-10-29 DIAGNOSIS — R82998 Other abnormal findings in urine: Secondary | ICD-10-CM | POA: Diagnosis not present

## 2021-10-29 DIAGNOSIS — E668 Other obesity: Secondary | ICD-10-CM

## 2021-10-29 DIAGNOSIS — I10 Essential (primary) hypertension: Secondary | ICD-10-CM | POA: Diagnosis not present

## 2021-10-29 DIAGNOSIS — N1831 Chronic kidney disease, stage 3a: Secondary | ICD-10-CM | POA: Diagnosis not present

## 2021-10-29 DIAGNOSIS — N189 Chronic kidney disease, unspecified: Secondary | ICD-10-CM | POA: Diagnosis not present

## 2021-11-08 ENCOUNTER — Other Ambulatory Visit: Payer: Self-pay | Admitting: Family Medicine

## 2021-11-08 DIAGNOSIS — I10 Essential (primary) hypertension: Secondary | ICD-10-CM

## 2021-11-08 DIAGNOSIS — N951 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: Secondary | ICD-10-CM

## 2021-11-08 MED ORDER — TRIAMTERENE-HCTZ 37.5-25 MG PO TABS: 1.0000 | ORAL_TABLET | Freq: Every day | ORAL | 1 refills | Status: DC

## 2021-11-08 MED ORDER — NORETHINDRONE ACETATE 5 MG PO TABS: 5.0000 mg | ORAL_TABLET | Freq: Every day | ORAL | 0 refills | Status: DC

## 2021-11-08 NOTE — Telephone Encounter (Signed)
I send in the one for [name] already. She just need the Norethindrone.

## 2021-11-08 NOTE — Telephone Encounter (Signed)
[place] faxed refill request for the following medications:  norethindrone (AYGESTIN) 5 MG tablet   triamterene-hydrochlorothiazide ([name]-25) 37.5-25 MG tablet    Please advise.

## 2021-12-10 ENCOUNTER — Other Ambulatory Visit: Payer: Self-pay

## 2021-12-10 ENCOUNTER — Ambulatory Visit: Payer: BC Managed Care – PPO | Admitting: Certified Registered Nurse Anesthetist

## 2021-12-10 ENCOUNTER — Ambulatory Visit
Admission: RE | Admit: 2021-12-10 | Discharge: 2021-12-10 | Disposition: A | Discharge status: Home / Self Care | Payer: BC Managed Care – PPO | Attending: Gastroenterology | Admitting: Gastroenterology

## 2021-12-10 ENCOUNTER — Encounter
Admission: RE | Disposition: A | Discharge status: Home / Self Care | Payer: Self-pay | Source: Home / Self Care | Attending: Gastroenterology

## 2021-12-10 ENCOUNTER — Encounter: Payer: Self-pay | Admitting: Gastroenterology

## 2021-12-10 DIAGNOSIS — D122 Benign neoplasm of ascending colon: Secondary | ICD-10-CM | POA: Diagnosis not present

## 2021-12-10 DIAGNOSIS — Z09 Encounter for follow-up examination after completed treatment for conditions other than malignant neoplasm: Secondary | ICD-10-CM | POA: Diagnosis not present

## 2021-12-10 DIAGNOSIS — D128 Benign neoplasm of rectum: Secondary | ICD-10-CM | POA: Diagnosis not present

## 2021-12-10 DIAGNOSIS — K635 Polyp of colon: Secondary | ICD-10-CM | POA: Diagnosis not present

## 2021-12-10 DIAGNOSIS — K621 Rectal polyp: Secondary | ICD-10-CM | POA: Insufficient documentation

## 2021-12-10 DIAGNOSIS — Z86010 Personal history of colonic polyps: Secondary | ICD-10-CM

## 2021-12-10 HISTORY — DX: Failed or difficult intubation, initial encounter: T88.4XXA

## 2021-12-10 HISTORY — PX: COLONOSCOPY WITH PROPOFOL: SHX5780

## 2021-12-10 SURGERY — COLONOSCOPY WITH PROPOFOL
Anesthesia: General

## 2021-12-10 MED ORDER — LIDOCAINE HCL (PF) 2 % IJ SOLN
INTRAMUSCULAR | Status: AC
  Filled 2021-12-10: qty 5

## 2021-12-10 MED ORDER — PROPOFOL 10 MG/ML IV BOLUS
INTRAVENOUS | Status: DC | PRN
  Administered 2021-12-10: 70 mg via INTRAVENOUS

## 2021-12-10 MED ORDER — SODIUM CHLORIDE 0.9 % IV SOLN: INTRAVENOUS | Status: DC

## 2021-12-10 MED ORDER — PROPOFOL 1000 MG/100ML IV EMUL
INTRAVENOUS | Status: AC
  Filled 2021-12-10: qty 100

## 2021-12-10 MED ORDER — PROPOFOL 500 MG/50ML IV EMUL
INTRAVENOUS | Status: DC | PRN
  Administered 2021-12-10: 150 ug/kg/min via INTRAVENOUS

## 2021-12-10 MED ORDER — LIDOCAINE HCL (CARDIAC) PF 100 MG/5ML IV SOSY
PREFILLED_SYRINGE | INTRAVENOUS | Status: DC | PRN
  Administered 2021-12-10: 50 mg via INTRAVENOUS

## 2021-12-10 NOTE — Anesthesia Preprocedure Evaluation (Signed)
Anesthesia Evaluation  Patient identified by MRN, date of birth, ID band Patient awake    Reviewed: Allergy & Precautions, NPO status , Patient's Chart, lab work & pertinent test results  History of Anesthesia Complications (+) DIFFICULT AIRWAY and history of anesthetic complications  Airway Mallampati: IV  TM Distance: >3 FB Neck ROM: full    Dental  (+) Teeth Intact   Pulmonary neg pulmonary ROS, neg shortness of breath, neg COPD,    Pulmonary exam normal        Cardiovascular (-) angina(-) Past MI and (-) CABG negative cardio ROS Normal cardiovascular exam     Neuro/Psych PSYCHIATRIC DISORDERS negative neurological ROS     GI/Hepatic negative GI ROS, Neg liver ROS,   Endo/Other  negative endocrine ROS  Renal/GU negative Renal ROS  negative genitourinary   Musculoskeletal   Abdominal   Peds  Hematology negative hematology ROS (+)   Anesthesia Other Findings Past Medical History: No date: Complication of anesthesia No date: Difficult intubation No date: Dizziness     Comment:  occas No date: Heart murmur No date: History of infertility No date: Obesity No date: PONV (postoperative nausea and vomiting) No date: Seasonal allergies No date: Spontaneous pneumothorax  Past Surgical History: 2012: ABLATION ON ENDOMETRIOSIS 12/07/2018: COLONOSCOPY WITH PROPOFOL; N/A     Comment:  Procedure: COLONOSCOPY WITH PROPOFOL;  Surgeon: Jonathon Bellows, MD;  Location: [facility] ENDOSCOPY;  Service:               Gastroenterology;  Laterality: N/A; 1997 and 1999: LUNG SURGERY; Left 1975: TONSILLECTOMY 08/31/2017: ULNAR COLLATERAL LIGAMENT REPAIR; Left     Comment:  Procedure: ULNAR COLLATERAL LIGAMENT REPAIR- LEFT THUMB;              Surgeon: Corky Mull, MD;  Location: [facility] ORS;                Service: Orthopedics;  Laterality: Left;  BMI    Body Mass Index: 36.05 kg/m       Reproductive/Obstetrics negative OB ROS                             Anesthesia Physical Anesthesia Plan  ASA: 2  Anesthesia Plan: General   Post-op Pain Management:    Induction: Inhalational  PONV Risk Score and Plan: 3 and Propofol infusion and TIVA  Airway Management Planned: Natural Airway and Nasal Cannula  Additional Equipment:   Intra-op Plan:   Post-operative Plan: Extubation in OR  Informed Consent: I have reviewed the patients History and Physical, chart, labs and discussed the procedure including the risks, benefits and alternatives for the proposed anesthesia with the patient or authorized representative who has indicated his/her understanding and acceptance.     Dental Advisory Given  Plan Discussed with: Anesthesiologist, CRNA and Surgeon  Anesthesia Plan Comments: (Parent consented for risks of anesthesia including but not limited to:  - adverse reactions to medications - damage to eyes, teeth, lips or other oral mucosa including nose bleeds - nerve damage due to positioning  - sore throat or hoarseness - Damage to heart, brain, nerves, lungs, other parts of body or loss of life  Parent voiced understanding.  )        Anesthesia Quick Evaluation

## 2021-12-10 NOTE — H&P (Signed)
Jonathon Bellows, MD [address] [place], [place], [place], [address] [address], [place], [place], [place], [address] Phone: [phone]  Fax: [phone]  Primary Care Physician:  Gwyneth Sprout, FNP   Pre-Procedure History & Physical: HPI:  Tracy Booth is a 51 y.o. female is here for an colonoscopy.   Past Medical History:  Diagnosis Date   Complication of anesthesia    Difficult intubation    Dizziness    occas   Heart murmur    History of infertility    Obesity    PONV (postoperative nausea and vomiting)    Seasonal allergies    Spontaneous pneumothorax     Past Surgical History:  Procedure Laterality Date   ABLATION ON ENDOMETRIOSIS  2012   COLONOSCOPY WITH PROPOFOL N/A 12/07/2018   Procedure: COLONOSCOPY WITH PROPOFOL;  Surgeon: Jonathon Bellows, MD;  Location: [facility] ENDOSCOPY;  Service: Gastroenterology;  Laterality: N/A;   LUNG SURGERY Left 1997 and [place] Left 08/31/2017   Procedure: ULNAR COLLATERAL LIGAMENT REPAIR- LEFT THUMB;  Surgeon: Corky Mull, MD;  Location: [facility] ORS;  Service: Orthopedics;  Laterality: Left;    Prior to Admission medications   Medication Sig Start Date End Date Taking? Authorizing Provider  cetirizine (ZYRTEC) 10 MG tablet Take 10 mg by mouth daily as needed for allergies.   Yes [provider]  escitalopram (LEXAPRO) 10 MG tablet TAKE 1 TABLET(10 MG) BY MOUTH AT BEDTIME 07/08/21  Yes Gwyneth Sprout, FNP  famotidine (PEPCID) 10 MG tablet Take 10 mg by mouth daily as needed for heartburn or indigestion.   Yes [provider]  norethindrone (AYGESTIN) 5 MG tablet Take 1 tablet (5 mg total) by mouth at bedtime. 11/08/21  Yes Tally Joe T, FNP  Probiotic CAPS Take 2 capsules by mouth at bedtime.   Yes [provider]  triamterene-hydrochlorothiazide ([name]-25) 37.5-25 MG tablet Take 1 tablet by mouth daily. 11/08/21  Yes Gwyneth Sprout, FNP     Allergies as of 09/20/2021   (No Known Allergies)    Family History  Problem Relation Age of Onset   Hypertension Mother    Arthritis Mother    Fibromyalgia Mother    Healthy Father    Breast cancer Maternal Grandmother    Ovarian cancer Maternal Grandmother    Healthy Brother     Social History   Socioeconomic History   Marital status: Married    Spouse name: Not on file   Number of children: Not on file   Years of education: Not on file   Highest education level: Not on file  Occupational History   Not on file  Tobacco Use   Smoking status: Never   Smokeless tobacco: Never  Vaping Use   Vaping Use: Never used  Substance and Sexual Activity   Alcohol use: Yes    Comment: rare   Drug use: Never   Sexual activity: Not on file  Other Topics Concern   Not on file  Social History Narrative   Not on file   Social Determinants of Health   Financial Resource Strain: Not on file  Food Insecurity: Not on file  Transportation Needs: Not on file  Physical Activity: Insufficiently Active (03/17/2017)   Exercise Vital Sign    Days of Exercise per Week: 3 days    Minutes of Exercise per Session: 40 min  Stress: Not on file  Social Connections: Not on file  Intimate Partner Violence: Not on file    Review of Systems: See HPI, otherwise negative ROS  Physical Exam: BP 121/72   Pulse 73   Temp (!) 96.8 F (36 C) (Temporal)   Resp 16   Ht '5\' 4"'$  (1.626 m)   Wt 95.3 kg   SpO2 99%   BMI 36.05 kg/m  General:   Alert,  pleasant and cooperative in NAD Head:  Normocephalic and atraumatic. Neck:  Supple; no masses or thyromegaly. Lungs:  Clear throughout to auscultation, normal respiratory effort.    Heart:  +S1, +S2, Regular rate and rhythm, No edema. Abdomen:  Soft, nontender and nondistended. Normal bowel sounds, without guarding, and without rebound.   Neurologic:  Alert and  oriented x4;  grossly normal neurologically.  Impression/Plan: Tracy Booth  is here for an colonoscopy to be performed for surveillance due to prior history of colon polyps   Risks, benefits, limitations, and alternatives regarding  colonoscopy have been reviewed with the patient.  Questions have been answered.  All parties agreeable.   Jonathon Bellows, MD  12/10/2021, 7:34 AM

## 2021-12-10 NOTE — Anesthesia Procedure Notes (Signed)
Date/Time: 12/10/2021 7:39 AM  Performed by: Johnna Acosta, CRNAPre-anesthesia Checklist: Patient identified, Emergency Drugs available, Suction available, Patient being monitored and Timeout performed Patient Re-evaluated:Patient Re-evaluated prior to induction Oxygen Delivery Method: Nasal cannula Preoxygenation: Pre-oxygenation with 100% oxygen Induction Type: IV induction

## 2021-12-10 NOTE — Transfer of Care (Signed)
Immediate Anesthesia Transfer of Care Note  Patient: Tracy Booth  Procedure(s) Performed: COLONOSCOPY WITH PROPOFOL  Patient Location: PACU  Anesthesia Type:General  Level of Consciousness: drowsy  Airway & Oxygen Therapy: Patient Spontanous Breathing  Post-op Assessment: Report given to RN and Post -op Vital signs reviewed and stable  Post vital signs: Reviewed and stable  Last Vitals:  Vitals Value Taken Time  BP 109/75 12/10/21 0806  Temp    Pulse 74 12/10/21 0806  Resp 16 12/10/21 0806  SpO2 99 % 12/10/21 0806    Last Pain:  Vitals:   12/10/21 0657  TempSrc: Temporal  PainSc: 0-No pain         Complications: No notable events documented.

## 2021-12-10 NOTE — Anesthesia Postprocedure Evaluation (Signed)
Anesthesia Post Note  Patient: Tracy Booth  Procedure(s) Performed: COLONOSCOPY WITH PROPOFOL  Patient location during evaluation: Endoscopy Anesthesia Type: General Level of consciousness: awake and alert Pain management: pain level controlled Vital Signs Assessment: post-procedure vital signs reviewed and stable Respiratory status: spontaneous breathing, nonlabored ventilation, respiratory function stable and patient connected to nasal cannula oxygen Cardiovascular status: blood pressure returned to baseline and stable Postop Assessment: no apparent nausea or vomiting Anesthetic complications: no   No notable events documented.   Last Vitals:  Vitals:   12/10/21 0806 12/10/21 0814  BP: 109/75 114/83  Pulse: 74 72  Resp: 16 (!) 31  Temp:    SpO2: 99% 100%    Last Pain:  Vitals:   12/10/21 0814  TempSrc:   PainSc: 0-No pain                 Dimas Millin

## 2021-12-10 NOTE — Op Note (Signed)
[facility] Gastroenterology Patient Name: Tracy Booth Procedure Date: 12/10/2021 7:37 AM MRN: [id number] Account #: [id number] Date of Birth: May 27, 1970 Admit Type: Outpatient Age: 51 Room: [facility] ENDO ROOM 4 Gender: Female Note Status: Finalized Instrument Name: Jasper Riling [id number] Procedure:             Colonoscopy Indications:           High risk colon cancer surveillance: Personal history                         of adenoma (10 mm or greater in size) Providers:             Jonathon Bellows MD, MD Referring MD:          Dr Rollene Rotunda, MD (Referring MD), Jaci Standard. Rollene Rotunda (Referring                         MD) Medicines:             Monitored Anesthesia Care Complications:         No immediate complications. Procedure:             Pre-Anesthesia Assessment:                        - Prior to the procedure, a History and Physical was                         performed, and patient medications, allergies and                         sensitivities were reviewed. The patient's tolerance                         of previous anesthesia was reviewed.                        - The risks and benefits of the procedure and the                         sedation options and risks were discussed with the                         patient. All questions were answered and informed                         consent was obtained.                        - ASA Grade Assessment: II - A patient with mild                         systemic disease.                        After obtaining informed consent, the colonoscope was                         passed under direct vision. Throughout the procedure,  the patient's blood pressure, pulse, and oxygen                         saturations were monitored continuously. The                         Colonoscope was introduced through the anus and                         advanced to the the cecum, identified by the                          appendiceal orifice. The colonoscopy was performed                         with ease. The patient tolerated the procedure well.                         The quality of the bowel preparation was excellent. Findings:      The perianal and digital rectal examinations were normal.      Five sessile polyps were found in the rectum. The polyps were 4 to 5 mm       in size. These polyps were removed with a cold snare. Resection and       retrieval were complete.      A 6 mm polyp was found in the ascending colon. The polyp was removed       with a cold snare. Resection and retrieval were complete.      The exam was otherwise without abnormality on direct and retroflexion       views. Impression:            - Five 4 to 5 mm polyps in the rectum, removed with a                         cold snare. Resected and retrieved.                        - One 6 mm polyp in the ascending colon, removed with                         a cold snare. Resected and retrieved.                        - The examination was otherwise normal on direct and                         retroflexion views. Recommendation:        - Discharge patient to home (with escort).                        - Resume previous diet.                        - Continue present medications.                        - Await pathology results.                        -  Repeat colonoscopy in 3 years for surveillance. Procedure Code(s):     --- Professional ---                        [phone], Colonoscopy, flexible; with removal of                         tumor(s), polyp(s), or other lesion(s) by snare                         technique Diagnosis Code(s):     --- Professional ---                        Z86.010, Personal history of colonic polyps                        K62.1, Rectal polyp                        K63.5, Polyp of colon CPT copyright 2019 American Medical Association. All rights reserved. The codes documented in this report are preliminary and  upon coder review may  be revised to meet current compliance requirements. Jonathon Bellows, MD Jonathon Bellows MD, MD 12/10/2021 8:02:14 AM This report has been signed electronically. Number of Addenda: 0 Note Initiated On: 12/10/2021 7:37 AM Scope Withdrawal Time: 0 hours 12 minutes 35 seconds  Total Procedure Duration: 0 hours 14 minutes 43 seconds  Estimated Blood Loss:  Estimated blood loss: none.      [facility]

## 2021-12-13 ENCOUNTER — Encounter: Payer: Self-pay | Admitting: Gastroenterology

## 2021-12-13 LAB — SURGICAL PATHOLOGY

## 2021-12-22 DIAGNOSIS — N1831 Chronic kidney disease, stage 3a: Secondary | ICD-10-CM | POA: Diagnosis not present

## 2021-12-22 DIAGNOSIS — I10 Essential (primary) hypertension: Secondary | ICD-10-CM | POA: Diagnosis not present

## 2021-12-22 DIAGNOSIS — F32A Depression, unspecified: Secondary | ICD-10-CM | POA: Diagnosis not present

## 2021-12-22 DIAGNOSIS — R82998 Other abnormal findings in urine: Secondary | ICD-10-CM | POA: Diagnosis not present

## 2022-01-03 ENCOUNTER — Telehealth: Payer: Self-pay | Admitting: Family Medicine

## 2022-01-03 DIAGNOSIS — F3341 Major depressive disorder, recurrent, in partial remission: Secondary | ICD-10-CM

## 2022-01-03 MED ORDER — ESCITALOPRAM OXALATE 10 MG PO TABS: ORAL_TABLET | ORAL | 0 refills | Status: DC

## 2022-01-03 NOTE — Telephone Encounter (Signed)
[facility] faxed refill request for the following medications:  escitalopram (LEXAPRO) 10 MG tablet   Please advise.

## 2022-01-03 NOTE — Addendum Note (Signed)
Addended by: Smitty Knudsen on: 01/03/2022 01:12 PM   Modules accepted: Orders

## 2022-02-07 ENCOUNTER — Other Ambulatory Visit: Payer: Self-pay | Admitting: Family Medicine

## 2022-02-07 DIAGNOSIS — N951 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: Secondary | ICD-10-CM

## 2022-02-24 ENCOUNTER — Telehealth: Payer: Self-pay

## 2022-02-24 NOTE — Telephone Encounter (Signed)
Sharyn Lull from [facility]) called wanting to request a change of diagnosis for patient's date-of-service on 11/2021 for a colonoscopy. Sharyn Lull stated that she wanted the diagnosis to be changed to preventative instead of diagnostic. I told her that patient had history of colon polyps and therefore she was to have a diagnostic colonoscopy. Sharyn Lull did not agree and so I told her that I would mention it to the physician but that there was no promise as this would be changed. I also gave her the number for Pre-service in case they have anything else to share with Sharyn Lull as I do not do anything with patient's services rendered or insurance. Please let me know if there was anything else I could assist [place] with. Thank you. Pre-service: [phone] per online information.

## 2022-02-25 NOTE — Telephone Encounter (Signed)
Ginger, can you please help me on this situation? I don't even know what exactly the insurance company is wanting. I don't understand what to do with this issue as I do not deal with patient's insurance, diagnosis or claims. Please advise.

## 2022-02-25 NOTE — Telephone Encounter (Signed)
High risk colon cancer surveillance: Personal history of adenoma (10 mm or greater in size)  That's the diagnosis in the chart and that's the reason it was performed for. Yes it was done to prevent colon cancer as she has had polyps in the past .

## 2022-03-01 NOTE — Telephone Encounter (Signed)
Tracy Booth, I wanted to follow up on this patient. She had called and left a message wanting to know how she could fix her diagnosis to preventative instead of diagnostic.

## 2022-03-17 NOTE — Telephone Encounter (Signed)
This patient had a positive cologuard done on 09/12/2018. Pt had a colonoscopy done by Dr. Vicente Males on 12/07/2018. Resulting in a 39m polyp, 194mpolyp and two 4 to 6 mm polyps removed.  On 01/30/2019, [facility]. Pt had a 4074molyp, 50 mm polyp, two 10 to 15 mm polyps, and one 5 mm polyp removed during this procedure. [facility]  On 12/10/21, Pt returned to Dr. AnnVicente Malesd he removed five 4 to 5 mm and one 6mm43mlyp.   The colonoscopy on 12/10/21, the one in question is not considered a screening because this is [facility], this pt's diagnoses for future colonoscopies will be hx of polyps.

## 2022-03-17 NOTE — Telephone Encounter (Signed)
Called patient but did not answer. Therefore, I will send her a patient message explaining why her procedure was coded as a diagnostic colonoscopy instead of a screening colonoscopy. Patient has a history of colon polyps.

## 2022-03-18 NOTE — Telephone Encounter (Signed)
Can't write as screening as it is surveillance- if she has the name of the person she spoke in billing we can speak to them and see what they mean by screening ?

## 2022-04-07 ENCOUNTER — Ambulatory Visit: Payer: BC Managed Care – PPO | Admitting: Family Medicine

## 2022-04-07 ENCOUNTER — Encounter: Payer: Self-pay | Admitting: Family Medicine

## 2022-04-07 VITALS — BP 110/74 | HR 89 | Temp 98.6°F | Wt 214.9 lb

## 2022-04-07 DIAGNOSIS — N951 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: Secondary | ICD-10-CM | POA: Insufficient documentation

## 2022-04-07 DIAGNOSIS — F3341 Major depressive disorder, recurrent, in partial remission: Secondary | ICD-10-CM

## 2022-04-07 DIAGNOSIS — I10 Essential (primary) hypertension: Secondary | ICD-10-CM

## 2022-04-07 DIAGNOSIS — R062 Wheezing: Secondary | ICD-10-CM | POA: Insufficient documentation

## 2022-04-07 DIAGNOSIS — J0140 Acute pansinusitis, unspecified: Secondary | ICD-10-CM | POA: Diagnosis not present

## 2022-04-07 LAB — POC COVID19 BINAXNOW: SARS Coronavirus 2 Ag: NEGATIVE

## 2022-04-07 LAB — POCT INFLUENZA A/B
Influenza A, POC: NEGATIVE
Influenza B, POC: NEGATIVE

## 2022-04-07 MED ORDER — AZITHROMYCIN 250 MG PO TABS: ORAL_TABLET | ORAL | 0 refills | Status: AC

## 2022-04-07 MED ORDER — PREDNISONE 50 MG PO TABS: 50.0000 mg | ORAL_TABLET | Freq: Every day | ORAL | 0 refills | Status: DC

## 2022-04-07 MED ORDER — TRIAMTERENE-HCTZ 37.5-25 MG PO TABS: 1.0000 | ORAL_TABLET | Freq: Every day | ORAL | 3 refills | Status: DC

## 2022-04-07 MED ORDER — ESCITALOPRAM OXALATE 10 MG PO TABS: ORAL_TABLET | ORAL | 3 refills | Status: DC

## 2022-04-07 MED ORDER — ALBUTEROL SULFATE HFA 108 (90 BASE) MCG/ACT IN AERS: 2.0000 | INHALATION_SPRAY | Freq: Four times a day (QID) | RESPIRATORY_TRACT | 2 refills | Status: DC | PRN

## 2022-04-07 MED ORDER — NORETHINDRONE ACETATE 5 MG PO TABS: 5.0000 mg | ORAL_TABLET | Freq: Every day | ORAL | 3 refills | Status: DC

## 2022-04-07 MED ORDER — SPACER/AERO-HOLDING CHAMBERS DEVI: 1.0000 | Freq: Two times a day (BID) | 0 refills | Status: DC | PRN

## 2022-04-07 NOTE — Assessment & Plan Note (Signed)
Chronic, stable At goal of <130/<80 Continue [name] 25 to assist Repeat labs with CPE
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
## 2022-04-07 NOTE — Assessment & Plan Note (Signed)
Acute, not improving with OTC medication Symptoms x6 days Negative covid and flu No known sick contacts RTC if symptoms worsen or seek emergent care

## 2022-04-07 NOTE — Assessment & Plan Note (Signed)
Acute in setting of pansinusitis; will use steroids and prn inhalers to assist Discussed proper use of inhaler to help RTC if not improved
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
## 2022-04-07 NOTE — Assessment & Plan Note (Signed)
Chronic, stable Continue medication to assist No further concerns

## 2022-04-24 ENCOUNTER — Encounter: Payer: Self-pay | Admitting: Family Medicine

## 2022-04-27 ENCOUNTER — Ambulatory Visit: Payer: BC Managed Care – PPO | Admitting: Family Medicine

## 2022-04-27 ENCOUNTER — Encounter: Payer: Self-pay | Admitting: Family Medicine

## 2022-04-27 VITALS — BP 124/70 | HR 89 | Ht 64.0 in | Wt 218.7 lb

## 2022-04-27 DIAGNOSIS — J208 Acute bronchitis due to other specified organisms: Secondary | ICD-10-CM | POA: Diagnosis not present

## 2022-04-27 DIAGNOSIS — B9689 Other specified bacterial agents as the cause of diseases classified elsewhere: Secondary | ICD-10-CM | POA: Insufficient documentation

## 2022-04-27 DIAGNOSIS — R051 Acute cough: Secondary | ICD-10-CM | POA: Insufficient documentation

## 2022-04-27 LAB — POC COVID19 BINAXNOW: SARS Coronavirus 2 Ag: NEGATIVE

## 2022-04-27 MED ORDER — PREDNISONE 10 MG PO TABS: ORAL_TABLET | ORAL | 0 refills | Status: DC

## 2022-04-27 MED ORDER — LEVOFLOXACIN 500 MG PO TABS: 500.0000 mg | ORAL_TABLET | Freq: Every day | ORAL | 0 refills | Status: AC

## 2022-04-27 NOTE — Assessment & Plan Note (Signed)
POC negative  

## 2022-04-27 NOTE — Progress Notes (Signed)
Established patient visit   Patient: Tracy Booth   DOB: [birth year]   52 y.o. Female  MRN: [id number] Visit Date: 04/27/2022  Today's healthcare provider: Gwyneth Sprout, FNP  Re Introduced to nurse practitioner role and practice setting.  All questions answered.  Discussed provider/patient relationship and expectations.  Subjective    Cough   HPI   Started Friday night. Slight yellow production. Shortness of breathe.  No sore throat or ear pain. Last edited by Clista Bernhardt, CMA on 04/27/2022  4:03 PM.      COUGH Duration: 1-2 weeks Circumstances of initial development of cough: URI Cough severity: severe Cough description: non-productive, dry, and irritating Aggravating factors:  cold weatherworse at night and exercise Alleviating factors: cold/sinus, mucinex, cough syrup, and nasal CCS Status:  worse Treatments attempted: none Wheezing: yes Shortness of breath: yes Chest pain: no Chest tightness:yes Nasal congestion: no Runny nose: yes Postnasal drip: yes Frequent throat clearing or swallowing: no Hemoptysis: no Fevers: no Night sweats: no Weight loss: no Heartburn: no Recent foreign travel: no Tuberculosis contacts: no  Medications: Outpatient Medications Prior to Visit  Medication Sig   albuterol (VENTOLIN HFA) 108 (90 Base) MCG/ACT inhaler Inhale 2 puffs into the lungs every 6 (six) hours as needed for wheezing or shortness of breath.   cetirizine (ZYRTEC) 10 MG tablet Take 10 mg by mouth daily as needed for allergies.   escitalopram (LEXAPRO) 10 MG tablet TAKE 1 TABLET(10 MG) BY MOUTH AT BEDTIME   famotidine (PEPCID) 10 MG tablet Take 10 mg by mouth daily as needed for heartburn or indigestion.   norethindrone (AYGESTIN) 5 MG tablet Take 1 tablet (5 mg total) by mouth daily.   Probiotic CAPS Take 2 capsules by mouth at bedtime.   Spacer/Aero-Holding Chambers DEVI 1 each by Does not apply route 3 times/day as needed-between meals & bedtime.    triamterene-hydrochlorothiazide ([name]-25) 37.5-25 MG tablet Take 1 tablet by mouth daily.   [DISCONTINUED] predniSONE (DELTASONE) 50 MG tablet Take 1 tablet (50 mg total) by mouth daily with breakfast.   No facility-administered medications prior to visit.    Review of Systems  Respiratory:  Positive for cough.     Last CBC Lab Results  Component Value Date   WBC 7.3 09/17/2021   HGB 13.4 09/17/2021   HCT 38.0 09/17/2021   MCV 89 09/17/2021   MCH 31.4 09/17/2021   RDW 12.4 09/17/2021   PLT 361 55/73/2202   Last metabolic panel Lab Results  Component Value Date   GLUCOSE 86 09/17/2021   NA 138 09/17/2021   K 3.7 09/17/2021   CL 100 09/17/2021   CO2 23 09/17/2021   BUN 15 09/17/2021   CREATININE 1.14 (H) 09/17/2021   EGFR 58 (L) 09/17/2021   CALCIUM 9.0 09/17/2021   PROT 6.7 09/17/2021   ALBUMIN 4.3 09/17/2021   LABGLOB 2.4 09/17/2021   AGRATIO 1.8 09/17/2021   BILITOT 0.5 09/17/2021   ALKPHOS 70 09/17/2021   AST 23 09/17/2021   ALT 20 09/17/2021       Objective    BP 124/70   Pulse 89   Ht '5\' 4"'$  (1.626 m)   Wt 218 lb 11.2 oz (99.2 kg)   SpO2 99%   BMI 37.54 kg/m  BP Readings from Last 3 Encounters:  04/27/22 124/70  04/07/22 110/74  12/10/21 114/83   Wt Readings from Last 3 Encounters:  04/27/22 218 lb 11.2 oz (99.2 kg)  04/07/22 214 lb 14.4 oz (97.5  kg)  12/10/21 210 lb (95.3 kg)   SpO2 Readings from Last 3 Encounters:  04/27/22 99%  04/07/22 98%  12/10/21 100%   Physical Exam Vitals and nursing note reviewed.  Constitutional:      General: She is not in acute distress.    Appearance: Normal appearance. She is obese. She is not ill-appearing, toxic-appearing or diaphoretic.  HENT:     Head: Normocephalic and atraumatic.     Right Ear: Tympanic membrane, ear canal and external ear normal.     Left Ear: Tympanic membrane, ear canal and external ear normal.     Nose: Rhinorrhea present. No congestion.     Mouth/Throat:     Mouth: Mucous  membranes are moist.     Pharynx: Oropharynx is clear. No oropharyngeal exudate.  Eyes:     Conjunctiva/sclera: Conjunctivae normal.     Pupils: Pupils are equal, round, and reactive to light.  Cardiovascular:     Rate and Rhythm: Normal rate and regular rhythm.     Pulses: Normal pulses.     Heart sounds: Normal heart sounds. No murmur heard.    No friction rub. No gallop.  Pulmonary:     Effort: Pulmonary effort is normal. No respiratory distress.     Breath sounds: Normal breath sounds. No stridor. No wheezing, rhonchi or rales.  Chest:     Chest wall: No tenderness.  Musculoskeletal:        General: No swelling, tenderness, deformity or signs of injury. Normal range of motion.     Cervical back: Normal range of motion and neck supple.     Right lower leg: No edema.     Left lower leg: No edema.  Skin:    General: Skin is warm and dry.     Capillary Refill: Capillary refill takes less than 2 seconds.     Coloration: Skin is not jaundiced or pale.     Findings: No bruising, erythema, lesion or rash.  Neurological:     General: No focal deficit present.     Mental Status: She is alert and oriented to person, place, and time. Mental status is at baseline.     Cranial Nerves: No cranial nerve deficit.     Sensory: No sensory deficit.     Motor: No weakness.     Coordination: Coordination normal.  Psychiatric:        Mood and Affect: Mood normal.        Behavior: Behavior normal.        Thought Content: Thought content normal.        Judgment: Judgment normal.      Results for orders placed or performed in visit on 04/27/22  POC COVID-19 BinaxNow  Result Value Ref Range   SARS Coronavirus 2 Ag Negative Negative    Assessment & Plan     Problem List Items Addressed This Visit       Respiratory   Acute bacterial bronchitis - Primary    Repeat prednisone given improved sinus concerns and recurrent cough and wheezing, SOB/DOE Sample of trelegy x 14 days to assist           Other   Acute cough    POC negative      Relevant Orders   POC COVID-19 BinaxNow (Completed)   Return if symptoms worsen or fail to improve.     Vonna Kotyk, FNP, have reviewed all documentation for this visit. The documentation on 04/27/22 for the exam, diagnosis,  procedures, and orders are all accurate and complete.  Gwyneth Sprout, [place] [phone] (phone) [phone] (fax)  [place]

## 2022-04-27 NOTE — Assessment & Plan Note (Addendum)
Repeat prednisone given improved sinus concerns and recurrent cough and wheezing, SOB/DOE Sample of trelegy x 14 days to assist

## 2022-06-20 DIAGNOSIS — I129 Hypertensive chronic kidney disease with stage 1 through stage 4 chronic kidney disease, or unspecified chronic kidney disease: Secondary | ICD-10-CM | POA: Diagnosis not present

## 2022-06-20 DIAGNOSIS — I10 Essential (primary) hypertension: Secondary | ICD-10-CM | POA: Diagnosis not present

## 2022-06-20 DIAGNOSIS — N2581 Secondary hyperparathyroidism of renal origin: Secondary | ICD-10-CM | POA: Diagnosis not present

## 2022-06-20 DIAGNOSIS — E668 Other obesity: Secondary | ICD-10-CM | POA: Diagnosis not present

## 2022-08-05 DIAGNOSIS — D2272 Melanocytic nevi of left lower limb, including hip: Secondary | ICD-10-CM | POA: Diagnosis not present

## 2022-08-05 DIAGNOSIS — D2261 Melanocytic nevi of right upper limb, including shoulder: Secondary | ICD-10-CM | POA: Diagnosis not present

## 2022-08-05 DIAGNOSIS — D2262 Melanocytic nevi of left upper limb, including shoulder: Secondary | ICD-10-CM | POA: Diagnosis not present

## 2022-08-05 DIAGNOSIS — D225 Melanocytic nevi of trunk: Secondary | ICD-10-CM | POA: Diagnosis not present

## 2022-11-10 ENCOUNTER — Other Ambulatory Visit: Payer: Self-pay | Admitting: Family Medicine

## 2022-11-10 DIAGNOSIS — Z1231 Encounter for screening mammogram for malignant neoplasm of breast: Secondary | ICD-10-CM

## 2022-11-11 ENCOUNTER — Encounter: Payer: Self-pay | Admitting: Family Medicine

## 2022-11-11 ENCOUNTER — Ambulatory Visit (INDEPENDENT_AMBULATORY_CARE_PROVIDER_SITE_OTHER): Payer: BC Managed Care – PPO | Admitting: Family Medicine

## 2022-11-11 ENCOUNTER — Ambulatory Visit
Admission: RE | Admit: 2022-11-11 | Discharge: 2022-11-11 | Disposition: A | Discharge status: Home / Self Care | Payer: BC Managed Care – PPO | Source: Ambulatory Visit | Attending: Family Medicine | Admitting: Family Medicine

## 2022-11-11 VITALS — BP 121/78 | HR 71 | Ht 64.0 in | Wt 223.6 lb

## 2022-11-11 DIAGNOSIS — H66002 Acute suppurative otitis media without spontaneous rupture of ear drum, left ear: Secondary | ICD-10-CM | POA: Diagnosis not present

## 2022-11-11 DIAGNOSIS — Z1231 Encounter for screening mammogram for malignant neoplasm of breast: Secondary | ICD-10-CM | POA: Diagnosis not present

## 2022-11-11 MED ORDER — AMOXICILLIN-POT CLAVULANATE 875-125 MG PO TABS: 1.0000 | ORAL_TABLET | Freq: Two times a day (BID) | ORAL | 0 refills | Status: DC

## 2022-11-11 NOTE — Assessment & Plan Note (Signed)
10+ days of URI symptoms No known exposure to COVID No travel L AOM present Slight rhonchi in LUL; cleared with cough Continue to recommend supportive medications

## 2022-11-11 NOTE — Progress Notes (Signed)
Established patient visit   Patient: Tracy Booth   DOB: 11-22-1970   52 y.o. Female  MRN: [id number] Visit Date: 11/11/2022  Today's healthcare provider: Jacky Kindle, FNP  Introduced to nurse practitioner role and practice setting.  All questions answered.  Discussed provider/patient relationship and expectations.  Subjective    HPI HPI   Patient is present due to lingering cough since 7/14 and feeling of fluid in ears (more so left ear) since 7/20. Patient reports taking mucinex which is helping and has tried home remedies with ear but still feels clogged  Last edited by Acey Lav, CMA on 11/11/2022  3:57 PM.      Medications: Outpatient Medications Prior to Visit  Medication Sig   cetirizine (ZYRTEC) 10 MG tablet Take 10 mg by mouth daily as needed for allergies.   escitalopram (LEXAPRO) 10 MG tablet TAKE 1 TABLET(10 MG) BY MOUTH AT BEDTIME   famotidine (PEPCID) 10 MG tablet Take 10 mg by mouth daily as needed for heartburn or indigestion.   norethindrone (AYGESTIN) 5 MG tablet Take 1 tablet (5 mg total) by mouth daily.   Probiotic CAPS Take 2 capsules by mouth at bedtime.   triamterene-hydrochlorothiazide ([name]-25) 37.5-25 MG tablet Take 1 tablet by mouth daily.   albuterol (VENTOLIN HFA) 108 (90 Base) MCG/ACT inhaler Inhale 2 puffs into the lungs every 6 (six) hours as needed for wheezing or shortness of breath.   predniSONE (DELTASONE) 10 MG tablet Day 1 & 2 take 6 tablets Day 3 &4 take 5 tablets Day 5 &6 take 4 tablets Day 7 & 8 take 3 tablets Day 9 & 10 take 2 tablets Day 11 & 12 take 1 tablet Day 13 & 14 take 1/2 tablet   Spacer/Aero-Holding Chambers DEVI 1 each by Does not apply route 3 times/day as needed-between meals & bedtime.   No facility-administered medications prior to visit.     Objective    BP 121/78   Pulse 71   Ht 5\' 4"  (1.626 m)   Wt 223 lb 9.6 oz (101.4 kg)   SpO2 100%   BMI 38.38 kg/m   Physical Exam Vitals and nursing note  reviewed.  Constitutional:      General: She is not in acute distress.    Appearance: Normal appearance. She is obese. She is not ill-appearing, toxic-appearing or diaphoretic.  HENT:     Head: Normocephalic and atraumatic.     Right Ear: Swelling present.     Left Ear: Swelling and tenderness present. Tympanic membrane is erythematous and bulging.  Cardiovascular:     Rate and Rhythm: Normal rate and regular rhythm.     Pulses: Normal pulses.     Heart sounds: Normal heart sounds. No murmur heard.    No friction rub. No gallop.  Pulmonary:     Effort: Pulmonary effort is normal. No respiratory distress.     Breath sounds: Normal breath sounds. No stridor. No wheezing, rhonchi or rales.  Chest:     Chest wall: No tenderness.  Abdominal:     General: Bowel sounds are normal.     Palpations: Abdomen is soft.  Musculoskeletal:        General: No swelling, tenderness, deformity or signs of injury. Normal range of motion.     Right lower leg: No edema.     Left lower leg: No edema.  Skin:    General: Skin is warm and dry.     Capillary Refill: Capillary  refill takes less than 2 seconds.     Coloration: Skin is not jaundiced or pale.     Findings: No bruising, erythema, lesion or rash.  Neurological:     General: No focal deficit present.     Mental Status: She is alert and oriented to person, place, and time. Mental status is at baseline.     Cranial Nerves: No cranial nerve deficit.     Sensory: No sensory deficit.     Motor: No weakness.     Coordination: Coordination normal.  Psychiatric:        Mood and Affect: Mood normal.        Behavior: Behavior normal.        Thought Content: Thought content normal.        Judgment: Judgment normal.      No results found for any visits on 11/11/22.  Assessment & Plan     Problem List Items Addressed This Visit       Nervous and Auditory   Non-recurrent acute suppurative otitis media of left ear without spontaneous rupture of  tympanic membrane - Primary    10+ days of URI symptoms No known exposure to COVID No travel L AOM present Slight rhonchi in LUL; cleared with cough Continue to recommend supportive medications      Relevant Medications   amoxicillin-clavulanate (AUGMENTIN) 875-125 MG tablet   Return if symptoms worsen or fail to improve.     Leilani Merl, FNP, have reviewed all documentation for this visit. The documentation on 11/11/22 for the exam, diagnosis, procedures, and orders are all accurate and complete.  Jacky Kindle, FNP  [facility] [phone] (phone) [phone] (fax)  [facility]

## 2022-11-15 NOTE — Progress Notes (Signed)
Hi Tracy Booth  Normal mammogram; repeat in 1 year.  Please let us know if you have any questions.  Thank you,  Tally Joe, FNP

## 2023-04-06 ENCOUNTER — Other Ambulatory Visit: Payer: Self-pay | Admitting: Family Medicine

## 2023-04-06 DIAGNOSIS — I10 Essential (primary) hypertension: Secondary | ICD-10-CM

## 2023-04-06 DIAGNOSIS — N951 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: Secondary | ICD-10-CM

## 2023-04-06 NOTE — Telephone Encounter (Signed)
Requested medication (s) are due for refill today - yes  Requested medication (s) are on the active medication list -yes  Future visit scheduled -no  Last refill: 04/07/22 #90 3RF  Notes to clinic: fails lab protocol- over 1 year-09/17/21  Requested Prescriptions  Pending Prescriptions Disp Refills   triamterene-hydrochlorothiazide ([name]-25) 37.5-25 MG tablet [Pharmacy Med Name: TRIAMTERENE 37.5MG / HCTZ25MG  TABS] 90 tablet 3    Sig: TAKE 1 TABLET BY MOUTH DAILY     Cardiovascular: Diuretic Combos Failed - 04/06/2023 10:36 AM      Failed - K in normal range and within 180 days    Potassium  Date Value Ref Range Status  09/17/2021 3.7 3.5 - 5.2 mmol/L Final         Failed - Na in normal range and within 180 days    Sodium  Date Value Ref Range Status  09/17/2021 138 134 - 144 mmol/L Final         Failed - Cr in normal range and within 180 days    Creat  Date Value Ref Range Status  03/24/2017 0.87 0.50 - 1.10 mg/dL Final   Creatinine, Ser  Date Value Ref Range Status  09/17/2021 1.14 (H) 0.57 - 1.00 mg/dL Final         Passed - Last BP in normal range    BP Readings from Last 1 Encounters:  11/11/22 121/78         Passed - Valid encounter within last 6 months    Recent Outpatient Visits           4 months ago Non-recurrent acute suppurative otitis media of left ear without spontaneous rupture of tympanic membrane   [facility] Merita Norton T, FNP   11 months ago Acute bacterial bronchitis   [facility] T, FNP   12 months ago Acute non-recurrent pansinusitis   [facility] Jacky Kindle, FNP   1 year ago Annual physical exam   [facility] Mer[facility] Norton T, FNP   2 years ago Essential hypertension   [facility] [place], Marzella Schlein, MD              Signed Prescriptions Disp Refills   norethindrone (AYGESTIN) 5  MG tablet 90 tablet 0    Sig: TAKE 1 TABLET(5 MG) BY MOUTH DAILY     OB/GYN: Contraceptives - Progestins Passed - 04/06/2023 10:36 AM      Passed - Last BP in normal range    BP Readings from Last 1 Encounters:  11/11/22 121/78         Passed - Valid encounter within last 12 months    Recent Outpatient Visits           4 months ago Non-recurrent acute suppurative otitis media of left ear without spontaneous rupture of tympanic membrane   [facility] T, FNP   11 months ago Acute bacterial bronchitis   [facility] Mer[facility] Norton T, FNP   12 months ago Acute non-recurrent pansinusitis   [facility] Jacky Kindle, FNP   1 year ago Annual physical exam   [facility] Jacky Kindle, FNP   2 years ago Essential hypertension   [place] [place], Marzella Schlein, MD              Passed -  Patient is not a smoker         Requested Prescriptions  Pending Prescriptions Disp Refills   triamterene-hydrochlorothiazide ([name]-25) 37.5-25 MG tablet [Pharmacy Med Name: TRIAMTERENE 37.5MG / HCTZ25MG  TABS] 90 tablet 3    Sig: TAKE 1 TABLET BY MOUTH DAILY     Cardiovascular: Diuretic Combos Failed - 04/06/2023 10:36 AM      Failed - K in normal range and within 180 days    Potassium  Date Value Ref Range Status  09/17/2021 3.7 3.5 - 5.2 mmol/L Final         Failed - Na in normal range and within 180 days    Sodium  Date Value Ref Range Status  09/17/2021 138 134 - 144 mmol/L Final         Failed - Cr in normal range and within 180 days    Creat  Date Value Ref Range Status  03/24/2017 0.87 0.50 - 1.10 mg/dL Final   Creatinine, Ser  Date Value Ref Range Status  09/17/2021 1.14 (H) 0.57 - 1.00 mg/dL Final         Passed - Last BP in normal range    BP Readings from Last 1 Encounters:  11/11/22 121/78         Passed - Valid  encounter within last 6 months    Recent Outpatient Visits           4 months ago Non-recurrent acute suppurative otitis media of left ear without spontaneous rupture of tympanic membrane   [facility] Mer[facility] Norton T, FNP   11 months ago Acute bacterial bronchitis   [facility] Mer[facility] Norton T, FNP   12 months ago Acute non-recurrent pansinusitis   [facility] Jacky Kindle, FNP   1 year ago Annual physical exam   [facility] Merita Norton T, FNP   2 years ago Essential hypertension   [facility] [place], Marzella Schlein, MD              Signed Prescriptions Disp Refills   norethindrone (AYGESTIN) 5 MG tablet 90 tablet 0    Sig: TAKE 1 TABLET(5 MG) BY MOUTH DAILY     OB/GYN: Contraceptives - Progestins Passed - 04/06/2023 10:36 AM      Passed - Last BP in normal range    BP Readings from Last 1 Encounters:  11/11/22 121/78         Passed - Valid encounter within last 12 months    Recent Outpatient Visits           4 months ago Non-recurrent acute suppurative otitis media of left ear without spontaneous rupture of tympanic membrane   [facility] Mer[facility] Norton T, FNP   11 months ago Acute bacterial bronchitis   [facility] Mer[facility] Norton T, FNP   12 months ago Acute non-recurrent pansinusitis   [facility] Jacky Kindle, FNP   1 year ago Annual physical exam   [facility] Jacky Kindle, FNP   2 years ago Essential hypertension   [facility] [place], Marzella Schlein, MD              Passed - Patient is not a smoker

## 2023-04-06 NOTE — Telephone Encounter (Signed)
Requested Prescriptions  Pending Prescriptions Disp Refills   norethindrone (AYGESTIN) 5 MG tablet [Pharmacy Med Name: NORETHINDRONE 5MG  TABLETS] 90 tablet 0    Sig: TAKE 1 TABLET(5 MG) BY MOUTH DAILY     OB/GYN: Contraceptives - Progestins Passed - 04/06/2023 10:34 AM      Passed - Last BP in normal range    BP Readings from Last 1 Encounters:  11/11/22 121/78         Passed - Valid encounter within last 12 months    Recent Outpatient Visits           4 months ago Non-recurrent acute suppurative otitis media of left ear without spontaneous rupture of tympanic membrane   [facility] Mer[facility] T, FNP   11 months ago Acute bacterial bronchitis   [facility] T, F[facility]   12 months ago Acute non-recurrent pansinusitis   [facility] Mer[facility] T, FNP   1 year ago Annual physical exam   [facility] Mer[facility] T, FNP   2 years ago Essential hypertension   [facility] [place], Marzella Schlein, MD              Passed - Patient is not a smoker       triamterene-hydrochlorothiazide ([name]-25) 37.5-25 MG tablet [Pharmacy Med Name: TRIAMTERENE 37.5MG / HCTZ25MG  TABS] 90 tablet 3    Sig: TAKE 1 TABLET BY MOUTH DAILY     Cardiovascular: Diuretic Combos Failed - 04/06/2023 10:34 AM      Failed - K in normal range and within 180 days    Potassium  Date Value Ref Range Status  09/17/2021 3.7 3.5 - 5.2 mmol/L Final         Failed - Na in normal range and within 180 days    Sodium  Date Value Ref Range Status  09/17/2021 138 134 - 144 mmol/L Final         Failed - Cr in normal range and within 180 days    Creat  Date Value Ref Range Status  03/24/2017 0.87 0.50 - 1.10 mg/dL Final   Creatinine, Ser  Date Value Ref Range Status  09/17/2021 1.14 (H) 0.57 - 1.00 mg/dL Final         Passed - Last BP in normal range    BP Readings  from Last 1 Encounters:  11/11/22 121/78         Passed - Valid encounter within last 6 months    Recent Outpatient Visits           4 months ago Non-recurrent acute suppurative otitis media of left ear without spontaneous rupture of tympanic membrane   [facility] Mer[facility] T, FNP   11 months ago Acute bacterial bronchitis   [facility] Mer[facility] Norton T, FNP   12 months ago Acute non-recurrent pansinusitis   [facility] Jacky Kindle, FNP   1 year ago Annual physical exam   [facility] Jacky Kindle, FNP   2 years ago Essential hypertension   [facility] [place], Marzella Schlein, MD

## 2023-06-19 ENCOUNTER — Other Ambulatory Visit: Payer: Self-pay

## 2023-06-19 ENCOUNTER — Telehealth: Payer: Self-pay | Admitting: Family Medicine

## 2023-06-19 DIAGNOSIS — F3341 Major depressive disorder, recurrent, in partial remission: Secondary | ICD-10-CM

## 2023-06-19 MED ORDER — ESCITALOPRAM OXALATE 10 MG PO TABS
ORAL_TABLET | ORAL | 0 refills | Status: DC
Start: 2023-06-19 — End: 2023-08-17

## 2023-06-19 NOTE — Telephone Encounter (Signed)
[facility] faxed refill request for the following medications:  escitalopram (LEXAPRO) 10 MG tablet   Please advise.  

## 2023-07-14 ENCOUNTER — Telehealth: Payer: Self-pay | Admitting: Family Medicine

## 2023-07-14 DIAGNOSIS — N951 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: Secondary | ICD-10-CM

## 2023-07-14 NOTE — Telephone Encounter (Signed)
 Patient called and advised she will need to transfer of care to another provider at the office due to [facility] is no longer at [facility], she agrees, scheduled with Dr. Payton Mccallum. Advised I will send this request, but the med is not on her list. She says she's not sure what it's for, but says it's a generic for something she takes, perhaps Aygestin on her list. Advised I will call the pharmacy to confirm. I called [facility] and they are closed for lunch until 2p. Will route to the office to reach out to verify the correct medication needed.   Copied from CRM [id number]. Topic: Clinical - Medication Question >> Jul 14, 2023 12:14 PM Tracy Booth wrote: Reason for CRM: patient called stated she was told by the pharmacy she could not refill her script for [facility]. Unable to locate on her current medication list. Please f/u with patient as she needs to know if she needs to schedule an appt

## 2023-07-17 MED ORDER — NORETHINDRONE ACETATE 5 MG PO TABS
5.0000 mg | ORAL_TABLET | Freq: Every day | ORAL | 0 refills | Status: DC
Start: 2023-07-17 — End: 2023-10-19

## 2023-08-17 ENCOUNTER — Other Ambulatory Visit: Payer: Self-pay | Admitting: Family Medicine

## 2023-08-17 DIAGNOSIS — F3341 Major depressive disorder, recurrent, in partial remission: Secondary | ICD-10-CM

## 2023-09-06 ENCOUNTER — Encounter: Payer: Self-pay | Admitting: Family Medicine

## 2023-09-06 ENCOUNTER — Other Ambulatory Visit: Payer: Self-pay | Admitting: Family Medicine

## 2023-09-06 ENCOUNTER — Ambulatory Visit: Admitting: Family Medicine

## 2023-09-06 VITALS — BP 117/74 | HR 71 | Temp 97.8°F | Ht 64.0 in | Wt 224.9 lb

## 2023-09-06 DIAGNOSIS — N1831 Chronic kidney disease, stage 3a: Secondary | ICD-10-CM

## 2023-09-06 DIAGNOSIS — M79641 Pain in right hand: Secondary | ICD-10-CM | POA: Diagnosis not present

## 2023-09-06 DIAGNOSIS — Z130 Encounter for screening for diseases of the blood and blood-forming organs and certain disorders involving the immune mechanism: Secondary | ICD-10-CM

## 2023-09-06 DIAGNOSIS — G5621 Lesion of ulnar nerve, right upper limb: Secondary | ICD-10-CM | POA: Diagnosis not present

## 2023-09-06 DIAGNOSIS — Z789 Other specified health status: Secondary | ICD-10-CM | POA: Diagnosis not present

## 2023-09-06 DIAGNOSIS — N951 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: Secondary | ICD-10-CM | POA: Diagnosis not present

## 2023-09-06 DIAGNOSIS — Z9889 Other specified postprocedural states: Secondary | ICD-10-CM

## 2023-09-06 DIAGNOSIS — I10 Essential (primary) hypertension: Secondary | ICD-10-CM

## 2023-09-06 DIAGNOSIS — R61 Generalized hyperhidrosis: Secondary | ICD-10-CM

## 2023-09-06 NOTE — Progress Notes (Signed)
 Established patient visit   Patient: Tracy Booth   DOB: October 03, 1970   53 y.o. Female  MRN: [id number] Visit Date: 09/06/2023  Today's healthcare provider: Carlean Charter, DO   Chief Complaint  Patient presents with   Establish Care    [facility]dap [facility] years ago. Pneumococcal Vaccine- declined   Hand Pain    Right hand pain, on the back of the hand.  Most of the time it is a throbbing and stinging when it is at it's worse.  Constantly sore and painful not sure if she has sprained it but been wearing a wrist brace every night to sleep.  If pain is bad enough she will wear it all day and to work too.  This has been going on for at least 6 months.  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   Menopause    Reports she had an ablasion done and is having some symptoms regarding menopause.  Not sure if is in menopause but is concerned of possible starting to go through it.  She is also taking Norethindrone -Acetate she was prescribed this when she had her ablasion about 14 years ago concerned if she needs to stop taking it.   Subjective    HPI Tracy Booth is a 53 year old female who presents with hand pain and menopause concerns.  She has been experiencing right hand pain for the past six months, primarily affecting her right hand. The pain is described as cramping, soreness, and sometimes sharp, shooting, or burning sensations. It is localized to the top of the hand, extending down the 4th and 5th fingers and part of the third, as well as the side of the hand, but not involving the thumb.  It reaches approximately 2 inches above the ulnar prominence.  Relief is found from wearing a wrist brace, especially at night, and she occasionally uses Aleve and topical Voltaren for pain management. Pulling on the hand provides temporary relief. She is right-handed and does not experience similar symptoms in her left hand. She works in an office setting, primarily  typing, which may contribute to her symptoms.  She has concerns regarding menopause, having undergone an ablation 14 years ago and currently taking norethindrone  (Aygestin ) for progesterone supplementation. She experiences occasional night sweats and increased ear wax production, which she associates with menopause symptoms. Her family history includes her mother undergoing a hysterectomy at 72 due to reproductive issues, and her grandmother having reproductive cancer issues. She is uncertain about her menopausal status due to the ablation and continuous progesterone use.      Medications: Outpatient Medications Prior to Visit  Medication Sig   cetirizine (ZYRTEC) 10 MG tablet Take 10 mg by mouth daily as needed for allergies.   escitalopram  (LEXAPRO ) 10 MG tablet TAKE 1 TABLET(10 MG) BY MOUTH AT BEDTIME   famotidine  (PEPCID ) 10 MG tablet Take 10 mg by mouth daily as needed for heartburn or indigestion.   norethindrone  (AYGESTIN ) 5 MG tablet Take 1 tablet (5 mg total) by mouth daily. Keep scheduled appointment   Probiotic CAPS Take 2 capsules by mouth at bedtime.   triamterene -hydrochlorothiazide ([name]-25) 37.5-25 MG tablet TAKE 1 TABLET BY MOUTH DAILY   [DISCONTINUED] amoxicillin -clavulanate (AUGMENTIN ) 875-125 MG tablet Take 1 tablet by mouth 2 (two) times daily.   No facility-administered medications prior to visit.        Objective    BP 117/74 (BP Location: Left  Arm, Patient Position: Sitting, Cuff Size: Normal)   Pulse 71   Temp 97.8 F (36.6 C) (Oral)   Ht 5\' 4"  (1.626 m)   Wt 224 lb 14.4 oz (102 kg)   SpO2 98%   BMI 38.60 kg/m     Physical Exam Vitals and nursing note reviewed.  Constitutional:      General: She is not in acute distress.    Appearance: Normal appearance.  HENT:     Head: Normocephalic and atraumatic.  Eyes:     General: No scleral icterus.    Conjunctiva/sclera: Conjunctivae normal.  Cardiovascular:     Rate and Rhythm: Normal rate.   Pulmonary:     Effort: Pulmonary effort is normal.  Neurological:     Mental Status: She is alert and oriented to person, place, and time. Mental status is at baseline.  Psychiatric:        Mood and Affect: Mood normal.        Behavior: Behavior normal.      No results found for any visits on 09/06/23.  Assessment & Plan    Transition of care  Right hand pain  Ulnar neuropathy at wrist, right -     Ambulatory referral to Neurology  Menopausal symptoms     Right hand pain; ulnar neuropathy Chronic right hand pain for six months, more pronounced on the ulnar side. Symptoms suggest ulnar tunnel syndrome. Discussed potential for surgery if symptoms worsen, but preferable to avoid. - Refer to neurology for potential EMG to assess nerve involvement. - Advise use of a mouse pad with wrist support. - Recommend avoiding trauma to the elbow. - Continue using wrist brace at night. - Consider gabapentin if symptoms worsen, but prefers Aleve as needed.  Menopausal symptoms At 53, experiences occasional night sweats and itchy ear. Patient concerned if norethindrone  affects menopausal status; discussed uterine protective nature of norethindrone  status post ablation when estrogen is still present. Prefers to minimize medication use. - Discontinue norethindrone  and monitor symptoms to assess for menopause. - Schedule follow-up in two months for symptom assessment and blood work.  Placed orders for blood work for patient to obtain prior to returning for her physical exam    Return in about 2 months (around 11/06/2023) for CPE, labs w/hormones.      I discussed the assessment and treatment plan with the patient  The patient was provided an opportunity to ask questions and all were answered. The patient agreed with the plan and demonstrated an understanding of the instructions.   The patient was advised to call back or seek an in-person evaluation if the symptoms worsen or if the  condition fails to improve as anticipated.    Carlean Charter, DO  [facility] [phone] (phone) [phone] (fax)  [facility]

## 2023-09-24 ENCOUNTER — Encounter: Payer: Self-pay | Admitting: Family Medicine

## 2023-10-15 IMAGING — MG MM DIGITAL SCREENING BILAT W/ TOMO AND CAD
8 series · 8 of 24 positions shown · non-contrast
Comparison: Previous exam(s).

CLINICAL DATA: Screening.

EXAM:
DIGITAL SCREENING BILATERAL MAMMOGRAM WITH TOMOSYNTHESIS AND CAD
TECHNIQUE: Bilateral screening digital craniocaudal and mediolateral oblique
mammograms were obtained. Bilateral screening digital breast
tomosynthesis was performed. The images were evaluated with
computer-aided detection.

[L MLO synth-2D]
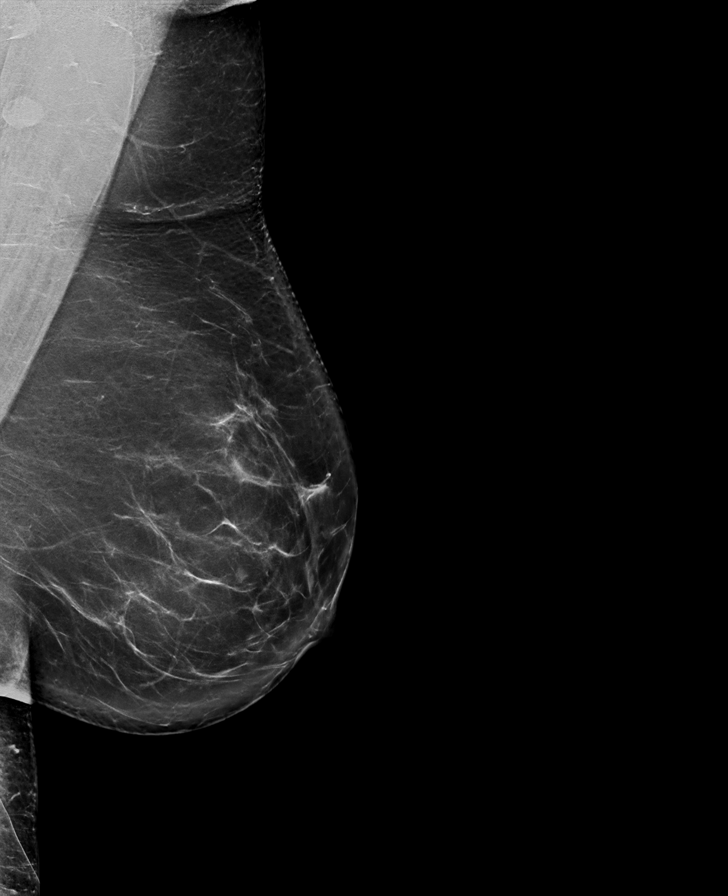

[R MLO synth-2D]
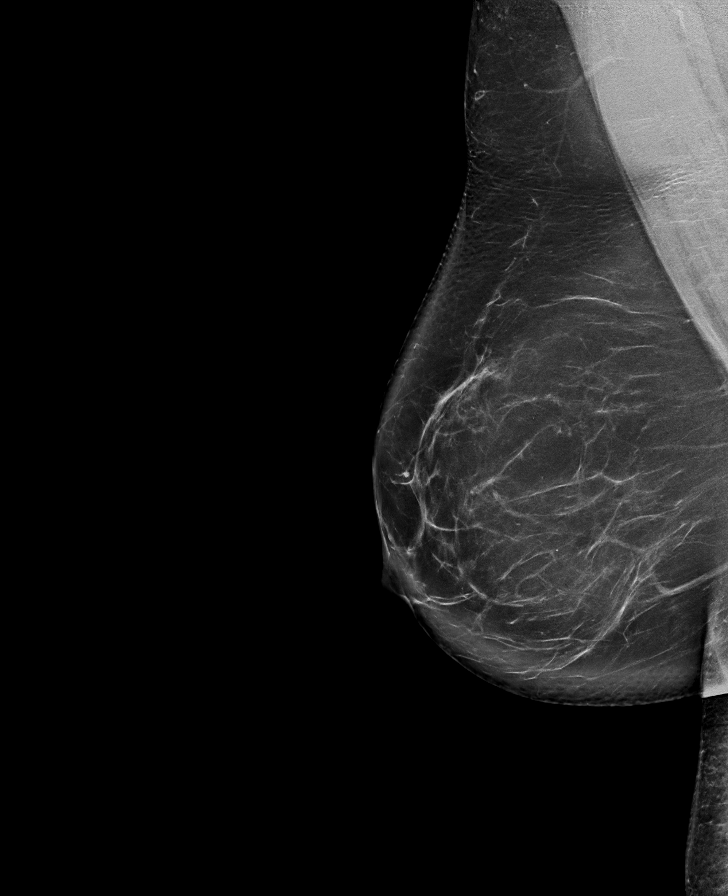

[L CC synth-2D]
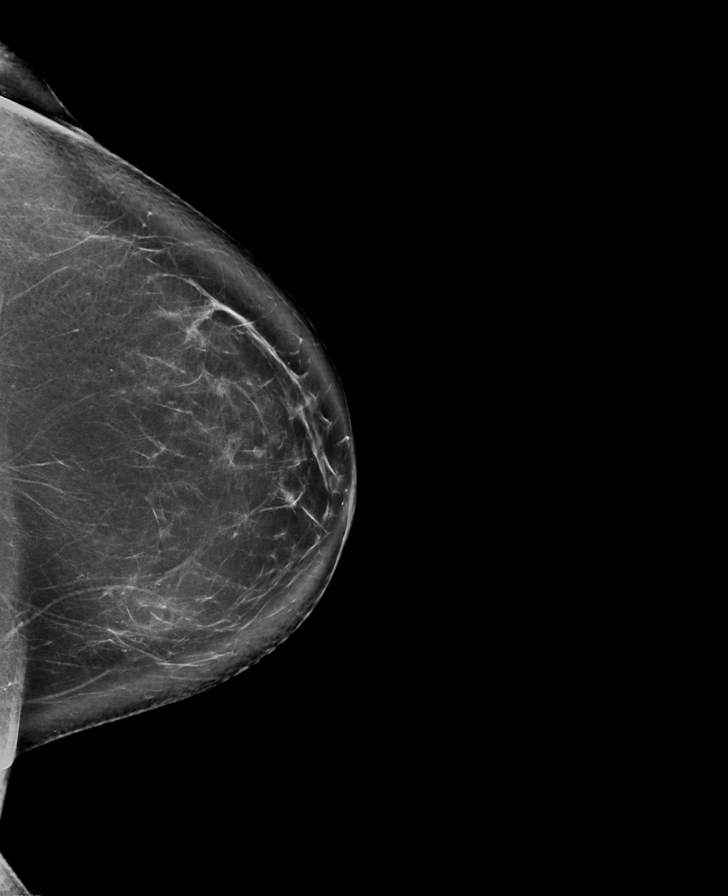

[R CC synth-2D]
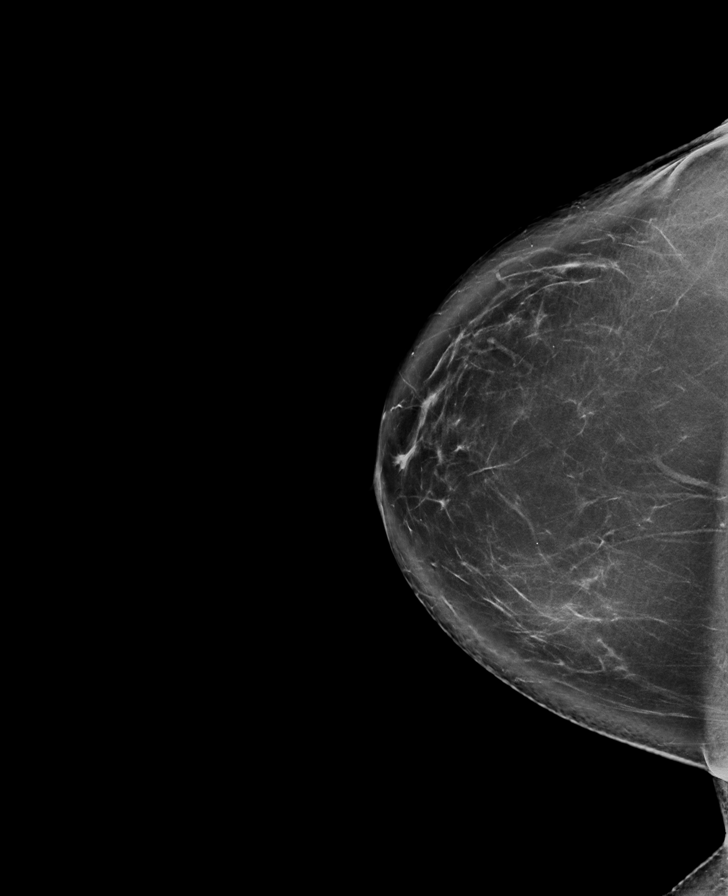

[R CC tomo · tomo slice 49/97.0]
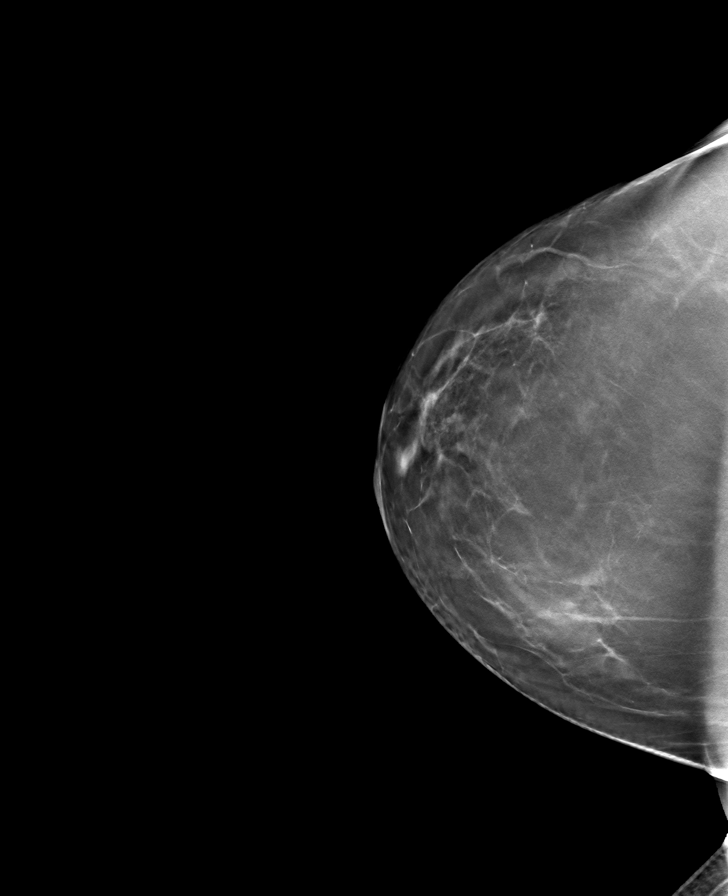

[L MLO tomo · tomo slice 47/92.0]
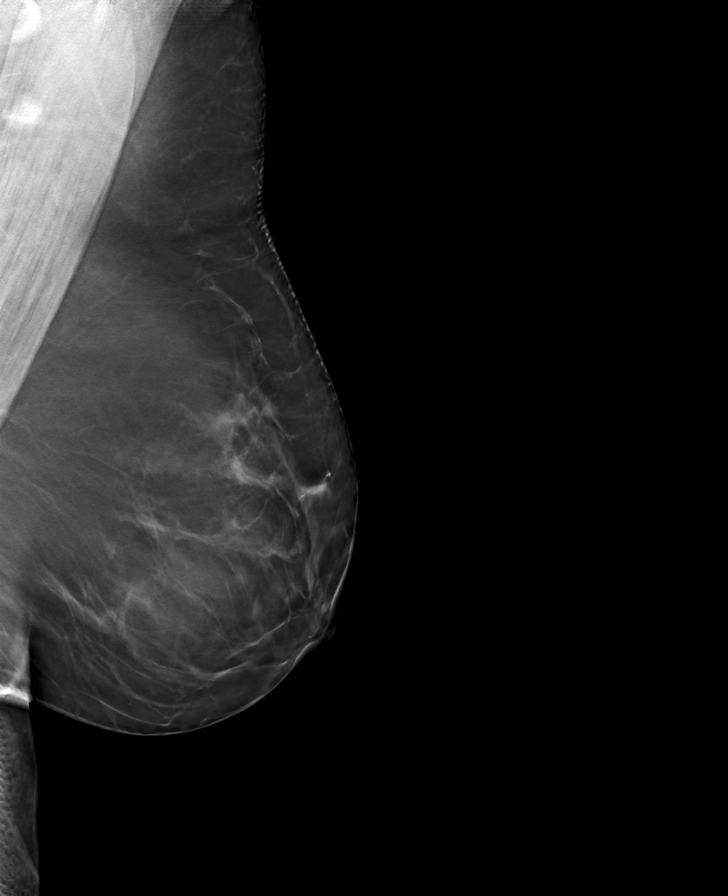

[R MLO tomo · tomo slice 54/107.0]
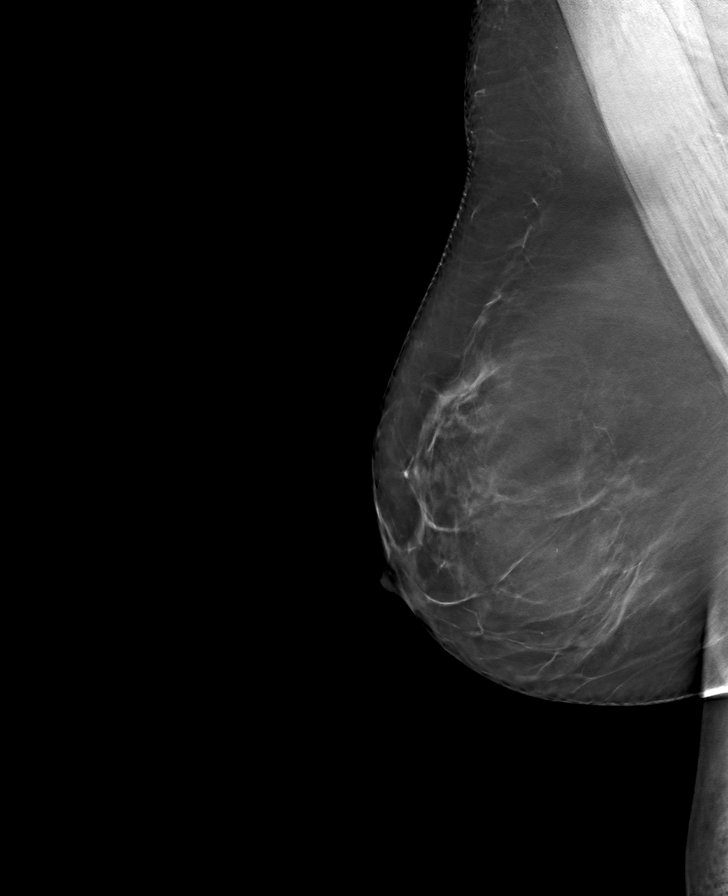

[L CC tomo · tomo slice 51/102.0]
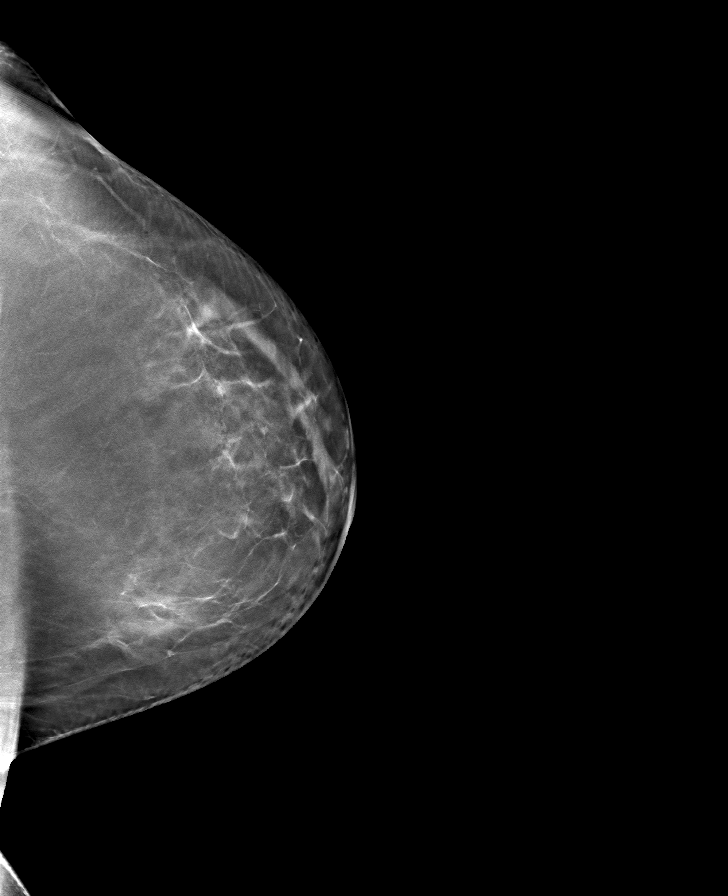

[8 of 24 positions shown; findings below may reference images not displayed]

ACR Breast Density Category b: There are scattered areas of
fibroglandular density.
FINDINGS: There are no findings suspicious for malignancy.
IMPRESSION: No mammographic evidence of malignancy. A result letter of this
screening mammogram will be mailed directly to the patient.

RECOMMENDATION:
Screening mammogram in one year. (Code:51-O-LD2)

BI-RADS CATEGORY  1: Negative.

## 2023-10-16 ENCOUNTER — Other Ambulatory Visit: Payer: Self-pay | Admitting: Family Medicine

## 2023-10-16 DIAGNOSIS — N951 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: Secondary | ICD-10-CM

## 2023-10-17 ENCOUNTER — Other Ambulatory Visit: Payer: Self-pay | Admitting: Family Medicine

## 2023-10-17 DIAGNOSIS — F3341 Major depressive disorder, recurrent, in partial remission: Secondary | ICD-10-CM

## 2023-11-03 LAB — COMPREHENSIVE METABOLIC PANEL WITH GFR
ALT: 107 IU/L — ABNORMAL HIGH (ref 0–32)
AST: 54 IU/L — ABNORMAL HIGH (ref 0–40)
Albumin: 4 g/dL (ref 3.8–4.9)
Alkaline Phosphatase: 117 IU/L (ref 44–121)
BUN/Creatinine Ratio: 14 (ref 9–23)
BUN: 21 mg/dL (ref 6–24)
Bilirubin Total: 0.5 mg/dL (ref 0.0–1.2)
CO2: 24 mmol/L (ref 20–29)
Calcium: 9.6 mg/dL (ref 8.7–10.2)
Chloride: 97 mmol/L (ref 96–106)
Creatinine, Ser: 1.45 mg/dL — ABNORMAL HIGH (ref 0.57–1.00)
Globulin, Total: 2.5 g/dL (ref 1.5–4.5)
Glucose: 93 mg/dL (ref 70–99)
Potassium: 3.7 mmol/L (ref 3.5–5.2)
Sodium: 139 mmol/L (ref 134–144)
Total Protein: 6.5 g/dL (ref 6.0–8.5)
eGFR: 43 mL/min/1.73 — ABNORMAL LOW (ref >59)

## 2023-11-03 LAB — MICROALBUMIN / CREATININE URINE RATIO
Creatinine, Urine: 183.8 mg/dL
Microalb/Creat Ratio: 5 mg/g{creat} (ref 0–29)
Microalbumin, Urine: 9.3 ug/mL

## 2023-11-03 LAB — HEMOGLOBIN A1C
Est. average glucose Bld gHb Est-mCnc: 108 mg/dL
Hgb A1c MFr Bld: 5.4 % (ref 4.8–5.6)

## 2023-11-03 LAB — LIPID PANEL
Chol/HDL Ratio: 3.6 ratio (ref 0.0–4.4)
Cholesterol, Total: 247 mg/dL — ABNORMAL HIGH (ref 100–199)
HDL: 68 mg/dL (ref >39)
LDL Chol Calc (NIH): 164 mg/dL — ABNORMAL HIGH (ref 0–99)
Triglycerides: 86 mg/dL (ref 0–149)
VLDL Cholesterol Cal: 15 mg/dL (ref 5–40)

## 2023-11-03 LAB — FSH/LH
FSH: 111 m[IU]/mL
LH: 76.1 m[IU]/mL

## 2023-11-03 LAB — ESTRADIOL: Estradiol: 11.5 pg/mL

## 2023-11-03 LAB — PROGESTERONE: Progesterone: 0.3 ng/mL

## 2023-11-08 ENCOUNTER — Encounter: Admitting: Family Medicine

## 2023-11-09 ENCOUNTER — Ambulatory Visit: Payer: Self-pay | Admitting: Family Medicine

## 2023-11-20 ENCOUNTER — Other Ambulatory Visit: Payer: Self-pay | Admitting: Family Medicine

## 2023-11-20 DIAGNOSIS — F3341 Major depressive disorder, recurrent, in partial remission: Secondary | ICD-10-CM

## 2023-11-21 ENCOUNTER — Other Ambulatory Visit: Payer: Self-pay | Admitting: Family Medicine

## 2023-11-21 DIAGNOSIS — Z1231 Encounter for screening mammogram for malignant neoplasm of breast: Secondary | ICD-10-CM

## 2023-12-08 ENCOUNTER — Ambulatory Visit
Admission: RE | Admit: 2023-12-08 | Discharge: 2023-12-08 | Disposition: A | Discharge status: Home / Self Care | Source: Ambulatory Visit | Attending: Family Medicine | Admitting: Family Medicine

## 2023-12-08 DIAGNOSIS — Z1231 Encounter for screening mammogram for malignant neoplasm of breast: Secondary | ICD-10-CM | POA: Diagnosis present

## 2023-12-12 ENCOUNTER — Ambulatory Visit: Payer: Self-pay | Admitting: Family Medicine

## 2023-12-15 ENCOUNTER — Encounter: Payer: Self-pay | Admitting: Family Medicine

## 2023-12-15 ENCOUNTER — Ambulatory Visit: Admitting: Family Medicine

## 2023-12-15 VITALS — BP 111/76 | HR 83 | Resp 16 | Ht 64.0 in | Wt 218.0 lb

## 2023-12-15 DIAGNOSIS — Z0001 Encounter for general adult medical examination with abnormal findings: Secondary | ICD-10-CM

## 2023-12-15 DIAGNOSIS — N1831 Chronic kidney disease, stage 3a: Secondary | ICD-10-CM

## 2023-12-15 DIAGNOSIS — R5382 Chronic fatigue, unspecified: Secondary | ICD-10-CM | POA: Diagnosis not present

## 2023-12-15 DIAGNOSIS — R6889 Other general symptoms and signs: Secondary | ICD-10-CM

## 2023-12-15 DIAGNOSIS — R7401 Elevation of levels of liver transaminase levels: Secondary | ICD-10-CM

## 2023-12-15 DIAGNOSIS — Z0000 Encounter for general adult medical examination without abnormal findings: Secondary | ICD-10-CM

## 2023-12-15 DIAGNOSIS — R631 Polydipsia: Secondary | ICD-10-CM | POA: Diagnosis not present

## 2023-12-15 DIAGNOSIS — N951 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: Secondary | ICD-10-CM

## 2023-12-15 NOTE — Progress Notes (Addendum)
 Complete physical exam   Patient: Tracy Booth   DOB: [birth year]   53 y.o. Female  MRN: [id number] Visit Date: 12/15/2023  Today's healthcare provider: LAURAINE LOISE BUOY, DO   Chief Complaint  Patient presents with   Annual Exam    Sleeping pattern: 7 hours Exercise: walking 4 times a week  Concerns: referral  to Endo Decline vaccines   Subjective    Tracy Booth is a 53 y.o. female who presents today for a complete physical exam.   HPI HPI     Annual Exam    Additional comments: Sleeping pattern: 7 hours Exercise: walking 4 times a week  Concerns: referral  to Endo Decline vaccines      Last edited by Wilfred Hargis RAMAN, CMA on 12/15/2023  1:39 PM.      Tracy Booth is a 53 year old female who presents for a referral to endocrinology for a comprehensive hormonal workup.  She experiences extreme fatigue, describing herself as 'tired all the time' and has fallen asleep at her desk. On weekends, she can sleep 12 to 14 hours without interruption. She also reports excessive thirst and frequent urination for approximately five years. She primarily drinks water and occasionally Sprite, noting her mouth is often dry.  She describes mood swings, dry skin, dry eyes, and temperature sensitivities, particularly feeling cold even in air-conditioned environments. She dislikes summer due to the temperature changes between indoors and outdoors. Muscle aches and stiffness are present, along with occasional memory problems, such as difficulty recalling words.  She has a history of fertility issues, which she associates with potential hormonal imbalances. Despite normal TSH levels, she seeks a thorough evaluation to rule out thyroid or other hormonal issues.  She has undergone two rounds of blood work, including a metabolic panel and tests ordered by neurology. She has a history of low vitamin D, for which she was prescribed a weekly supplement for eight weeks. Her B12 levels  are borderline low, and she has not been taking biotin supplements.  She experiences shortness of breath, particularly when walking up stairs, which she attributes to scar tissue on her left lung from previous surgeries. No recent dizziness, headaches, nausea, diarrhea, constipation, fevers, chills, or leg swelling. She reports lower back pain and knee joint pain, especially when using stairs, and describes her muscle aches as generalized.    Past Medical History:  Diagnosis Date   Complication of anesthesia    Difficult intubation    Dizziness    occas   Heart murmur    History of infertility    Obesity    PONV (postoperative nausea and vomiting)    Seasonal allergies    Spontaneous pneumothorax    Past Surgical History:  Procedure Laterality Date   ABLATION ON ENDOMETRIOSIS  2012   COLONOSCOPY WITH PROPOFOL  N/A 12/07/2018   Procedure: COLONOSCOPY WITH PROPOFOL ;  Surgeon: Therisa Bi, MD;  Location: [facility] ENDOSCOPY;  Service: Gastroenterology;  Laterality: N/A;   COLONOSCOPY WITH PROPOFOL  N/A 12/10/2021   Procedure: COLONOSCOPY WITH PROPOFOL ;  Surgeon: Therisa Bi, MD;  Location: [facility] ENDOSCOPY;  Service: Gastroenterology;  Laterality: N/A;   LUNG SURGERY Left 1997 and 1999   TONSILLECTOMY  1975   ULNAR COLLATERAL LIGAMENT REPAIR Left 08/31/2017   Procedure: ULNAR COLLATERAL LIGAMENT REPAIR- LEFT THUMB;  Surgeon: Edie Norleen PARAS, MD;  Location: [facility] ORS;  Service: Orthopedics;  Laterality: Left;   Social History   Socioeconomic History   Marital status: Married  Spouse name: Not on file   Number of children: Not on file   Years of education: Not on file   Highest education level: 12th grade  Occupational History   Not on file  Tobacco Use   Smoking status: Never   Smokeless tobacco: Never  Vaping Use   Vaping status: Never Used  Substance and Sexual Activity   Alcohol use: Yes    Comment: rare   Drug use: Never   Sexual activity: Not on file  Other Topics Concern    Not on file  Social History Narrative   Not on file   Social Drivers of Health   Financial Resource Strain: Low Risk  (12/11/2023)   Overall Financial Resource Strain (CARDIA)    Difficulty of Paying Living Expenses: Not hard at all  Food Insecurity: No Food Insecurity (12/11/2023)   Hunger Vital Sign    Worried About Running Out of Food in the Last Year: Never true    Ran Out of Food in the Last Year: Never true  Transportation Needs: No Transportation Needs (12/11/2023)   PRAPARE - Administrator, Civil Service (Medical): No    Lack of Transportation (Non-Medical): No  Physical Activity: Insufficiently Active (12/11/2023)   Exercise Vital Sign    Days of Exercise per Week: 3 days    Minutes of Exercise per Session: 20 min  Stress: No Stress Concern Present (12/11/2023)   [employer] of Occupational Health - Occupational Stress Questionnaire    Feeling of Stress: Not at all  Social Connections: Socially Integrated (12/11/2023)   Social Connection and Isolation Panel    Frequency of Communication with Friends and Family: Twice a week    Frequency of Social Gatherings with Friends and Family: Once a week    Attends Religious Services: More than 4 times per year    Active Member of [employer] or Organizations: Yes    Attends Engineer, Structural: More than 4 times per year    Marital Status: Married  Catering Manager Violence: Not At Risk (12/15/2023)   Humiliation, Afraid, Rape, and Kick questionnaire    Fear of Current or Ex-Partner: No    Emotionally Abused: No    Physically Abused: No    Sexually Abused: No   Family Status  Relation Name Status   Mother  Alive   Father  Alive   MGM  Deceased   Brother  Alive   Son  Alive  No partnership data on file   Family History  Problem Relation Age of Onset   Hypertension Mother    Arthritis Mother    Fibromyalgia Mother    Healthy Father    Breast cancer Maternal Grandmother    Ovarian cancer Maternal  Grandmother    Healthy Brother    No Known Allergies  Patient Care Team: Osiel Stick, Lauraine SAILOR, DO as PCP - General (Family Medicine)   Medications: Outpatient Medications Prior to Visit  Medication Sig   cetirizine (ZYRTEC) 10 MG tablet Take 10 mg by mouth daily as needed for allergies.   ergocalciferol (VITAMIN D2) 1.25 MG (50000 UT) capsule    famotidine  (PEPCID ) 10 MG tablet Take 10 mg by mouth daily as needed for heartburn or indigestion.   Probiotic CAPS Take 2 capsules by mouth at bedtime.   triamterene -hydrochlorothiazide ([name]-25) 37.5-25 MG tablet TAKE 1 TABLET BY MOUTH DAILY   [DISCONTINUED] escitalopram  (LEXAPRO ) 10 MG tablet TAKE 1 TABLET(10 MG) BY MOUTH AT BEDTIME   [DISCONTINUED] GALLIFREY  5  MG tablet TAKE 1 TABLET(5 MG) BY MOUTH DAILY   No facility-administered medications prior to visit.    Review of Systems  Constitutional:  Negative for chills, fatigue and fever.  HENT:  Positive for postnasal drip. Negative for congestion, ear pain, rhinorrhea, sneezing and sore throat.   Eyes: Negative.  Negative for pain and redness.  Respiratory:  Negative for cough, shortness of breath and wheezing.   Cardiovascular:  Negative for chest pain and leg swelling.  Gastrointestinal:  Negative for abdominal pain, blood in stool, constipation, diarrhea and nausea.  Endocrine: Positive for cold intolerance, polydipsia and polyuria. Negative for polyphagia.  Genitourinary: Negative.  Negative for dysuria, flank pain, hematuria, pelvic pain, vaginal bleeding and vaginal discharge.  Musculoskeletal:  Positive for back pain (low back, intermittent) and myalgias. Negative for arthralgias (bilateral knees, intermittent), gait problem and joint swelling.  Skin:  Negative for rash.  Neurological: Negative.  Negative for dizziness, tremors, seizures, weakness, light-headedness, numbness and headaches.  Hematological:  Negative for adenopathy.  Psychiatric/Behavioral: Negative.  Negative for  behavioral problems, confusion and dysphoric mood. The patient is not nervous/anxious and is not hyperactive.       Objective    BP 111/76 (BP Location: Left Arm, Patient Position: Sitting, Cuff Size: Large)   Pulse 83   Resp 16   Ht 5' 4 (1.626 m)   Wt 218 lb (98.9 kg)   SpO2 98%   BMI 37.42 kg/m    Physical Exam Vitals and nursing note reviewed.  Constitutional:      General: She is awake.     Appearance: Normal appearance.  HENT:     Head: Normocephalic and atraumatic.     Right Ear: Ear canal and external ear normal. Tympanic membrane is scarred.     Left Ear: Tympanic membrane, ear canal and external ear normal.     Nose: Nose normal.     Mouth/Throat:     Mouth: Mucous membranes are moist.     Pharynx: Oropharynx is clear. No oropharyngeal exudate or posterior oropharyngeal erythema.  Eyes:     General: No scleral icterus.    Extraocular Movements: Extraocular movements intact.     Conjunctiva/sclera: Conjunctivae normal.     Pupils: Pupils are equal, round, and reactive to light.  Neck:     Thyroid: No thyromegaly or thyroid tenderness.  Cardiovascular:     Rate and Rhythm: Normal rate and regular rhythm.     Pulses: Normal pulses.     Heart sounds: Normal heart sounds.  Pulmonary:     Effort: Pulmonary effort is normal. No tachypnea, bradypnea or respiratory distress.     Breath sounds: Normal breath sounds. No stridor. No wheezing, rhonchi or rales.  Abdominal:     General: Bowel sounds are normal. There is no distension.     Palpations: Abdomen is soft. There is no mass.     Tenderness: There is no abdominal tenderness. There is no guarding.     Hernia: No hernia is present.  Musculoskeletal:     Cervical back: Normal range of motion and neck supple.     Right lower leg: No edema.     Left lower leg: No edema.  Lymphadenopathy:     Cervical: No cervical adenopathy.  Skin:    General: Skin is warm and dry.  Neurological:     Mental Status: She is  alert and oriented to person, place, and time. Mental status is at baseline.  Psychiatric:  Mood and Affect: Mood normal.        Behavior: Behavior normal.      Last depression screening scores    11/11/2022    3:57 PM 04/27/2022    4:10 PM 04/07/2022    8:58 AM  PHQ 2/9 Scores  PHQ - 2 Score 0 0 1  PHQ- 9 Score  4  8      Data saved with a previous flowsheet row definition   Last fall risk screening    11/11/2022    3:57 PM  Fall Risk   Falls in the past year? 0  Injury with Fall? 0  Risk for fall due to : No Fall Risks  Follow up Falls evaluation completed   Last Audit-C alcohol use screening    12/11/2023   10:16 AM  Alcohol Use Disorder Test (AUDIT)  1. How often do you have a drink containing alcohol? 1  2. How many drinks containing alcohol do you have on a typical day when you are drinking? 0  3. How often do you have six or more drinks on one occasion? 0  AUDIT-C Score 1      Patient-reported   A score of 3 or more in women, and 4 or more in men indicates increased risk for alcohol abuse, EXCEPT if all of the points are from question 1   Results for orders placed or performed in visit on 12/15/23  Microalbumin / creatinine urine ratio  Result Value Ref Range   Creatinine, Urine 214.5 Not Estab. mg/dL   Microalbumin, Urine 6.3 Not Estab. ug/mL   Microalb/Creat Ratio 3 0 - 29 mg/g creat  FSH/LH  Result Value Ref Range   LH 72.1 mIU/mL   FSH 123.0 mIU/mL  Estradiol   Result Value Ref Range   Estradiol , Serum, MS 3.7 pg/mL   Free Estradiol , Percent 1.5 %   Free Estradiol , Serum 0.06 (L) pg/mL  Progesterone   Result Value Ref Range   Progesterone  <0.1 ng/mL  TSH+T4F+T3Free  Result Value Ref Range   TSH 2.500 0.450 - 4.500 uIU/mL   T3, Free 2.7 2.0 - 4.4 pg/mL   Free T4 1.21 0.82 - 1.77 ng/dL  Iron, TIBC and Ferritin Panel  Result Value Ref Range   Total Iron Binding Capacity 337 250 - 450 ug/dL   UIBC 722 868 - 574 ug/dL   Iron 60 27 - 840  ug/dL   Iron Saturation 18 15 - 55 %   Ferritin 101 15 - 150 ng/mL  ANA 12Plus Profile, Do All RDL  Result Value Ref Range   Anti-Nuclear Ab by IFA (RDL) Negative Negative   Anti-Centromere Ab (RDL) <1:40 <1:40   Anti-dsDNA Ab by Farr(RDL) <8.0 <8.0 IU/mL   Anti-Sm Ab (RDL) <20 <20 Units   Anti-U1 RNP Ab (RDL) <20 <20 Units   Anti-Ro (SS-A) Ab (RDL) <20 <20 Units   Anti-La (SS-B) Ab (RDL) <20 <20 Units   Anti-Scl-70 Ab (RDL) <20 <20 Units   Anti-Cardiolipin Ab, IgG (RDL) <15 <15 GPL U/mL   Anti-Cardiolipin Ab, IgA (RDL) <12 <12 APL U/mL   Anti-Cardiolipin Ab, IgM (RDL) <13 <13 MPL U/mL   C3 Complement (RDL) 189 (H) 90 - 180 mg/dL   C4 Complement (RDL) 36 10 - 40 mg/dL   Anti-TPO Ab (RDL) <0.9 <9.0 IU/mL   Anti-Chromatin Ab, IgG (RDL) <20 <20 Units   Anti-CCP Ab, IgG & IgA (RDL) <20 <20 Units   Rheumatoid Factor by Turb RDL <14 <14 IU/mL  C-reactive protein  Result Value Ref Range   CRP 12 (H) 0 - 10 mg/L  Osmolality, urine  Result Value Ref Range   Osmolality, Ur 644 mOsmol/kg  Sodium, urine, random  Result Value Ref Range   Sodium, Ur 79 Not Estab. mmol/L  Potassium, urine, random  Result Value Ref Range   Potassium Urine 62.1 Not Estab. mmol/L  Chloride, urine, random  Result Value Ref Range   Chloride, Ur 86 Not Estab. mmol/L  Urea  Nitrogen, Urine  Result Value Ref Range   Urea  Nitrogen, Ur 874 Not Estab. mg/dL  Urinalysis  Result Value Ref Range   Specific Gravity, UA 1.021 1.005 - 1.030   pH, UA 6.0 5.0 - 7.5   Color, UA Yellow Yellow   Appearance Ur Clear Clear   Leukocytes,UA Negative Negative   Protein,UA Negative Negative/Trace   Glucose, UA Negative Negative   Ketones, UA Negative Negative   RBC, UA Negative Negative   Bilirubin, UA Negative Negative   Urobilinogen, Ur 0.2 0.2 - 1.0 mg/dL   Nitrite, UA Negative Negative  Comprehensive metabolic panel with GFR  Result Value Ref Range   Glucose 87 70 - 99 mg/dL   BUN 18 6 - 24 mg/dL   Creatinine, Ser  8.58 (H) 0.57 - 1.00 mg/dL   eGFR 44 (L) >40 fO/fpw/8.26   BUN/Creatinine Ratio 13 9 - 23   Sodium 140 134 - 144 mmol/L   Potassium 3.8 3.5 - 5.2 mmol/L   Chloride 97 96 - 106 mmol/L   CO2 23 20 - 29 mmol/L   Calcium 9.7 8.7 - 10.2 mg/dL   Total Protein 6.8 6.0 - 8.5 g/dL   Albumin 4.3 3.8 - 4.9 g/dL   Globulin, Total 2.5 1.5 - 4.5 g/dL   Bilirubin Total 0.3 0.0 - 1.2 mg/dL   Alkaline Phosphatase 132 (H) 44 - 121 IU/L   AST 30 0 - 40 IU/L   ALT 40 (H) 0 - 32 IU/L    Assessment & Plan    Routine Health Maintenance and Physical Exam  Exercise Activities and Dietary recommendations  Goals   None     Immunization History  Administered Date(s) Administered   PFIZER(Purple Top)SARS-COV-2 Vaccination 08/03/2019, 08/28/2019, 05/02/2020   Tdap 11/25/2012   Zoster Recombinant(Shingrix) 06/09/2020, 09/11/2020    Health Maintenance  Topic Date Due   COVID-19 Vaccine (4 - 2025-26 season) 12/18/2023   Influenza Vaccine  07/16/2024 (Originally 11/17/2023)   DTaP/Tdap/Td (2 - Td or Tdap) 09/05/2024 (Originally 11/26/2022)   Pneumococcal Vaccine: 50+ Years (1 of 2 - PCV) 12/14/2024 (Originally 08/14/1987)   Hepatitis B Vaccines 19-59 Average Risk (1 of 3 - 19+ 3-dose series) 12/14/2024 (Originally 08/14/1987)   Colonoscopy  12/10/2024   Mammogram  12/07/2025   Cervical Cancer Screening (HPV/Pap Cotest)  09/18/2026   Hepatitis C Screening  Completed   HIV Screening  Completed   Zoster Vaccines- Shingrix  Completed   HPV VACCINES  Aged Out   Meningococcal B Vaccine  Aged Out   Fecal DNA (Cologuard)  Discontinued    Discussed health benefits of physical activity, and encouraged her to engage in regular exercise appropriate for her age and condition.   Annual physical exam  Chronic fatigue -     TSH+T4F+T3Free -     Iron, TIBC and Ferritin Panel -     ANA 12Plus Profile, Do All RDL -     C-reactive protein -     Ambulatory referral to Endocrinology  Excessive thirst -  ANA  12Plus Profile, Do All RDL -     C-reactive protein -     Osmolality, urine -     Sodium, urine, random -     Potassium, urine, random -     Chloride, urine, random -     Urea  nitrogen, urine -     Urinalysis -     Ambulatory referral to Endocrinology  Cold intolerance -     ANA 12Plus Profile, Do All RDL -     C-reactive protein -     Ambulatory referral to Endocrinology  Stage 3a chronic kidney disease (HCC) -     Microalbumin / creatinine urine ratio -     Comprehensive metabolic panel with GFR  Menopausal symptoms -     FSH/LH -     Estradiol  -     Progesterone   Transaminitis -     Comprehensive metabolic panel with GFR       Annual physical exam Routine adult wellness visit. Physical exam overall unremarkable except as noted above. Routine lab work ordered as noted.  - Discussed general health maintenance, including the need for a pneumonia vaccine, which she declined.  Chronic kidney disease, stage 3a Chronic kidney disease, stage 3a.  Patient discontinued nephrology follow-up due to dissatisfaction with the provider.  Continue to optimize risk factors.  Transaminitis Recent elevation in liver transaminase levels on two tests, showing a downward trend indicating potential resolution.  Has appointment scheduled with GI. - Repeat comprehensive metabolic panel (CMP) to monitor liver transaminase levels.  Polydipsia and polyuria Chronic excessive thirst and urination for approximately five years. Differential includes possible endocrine disorder. - Order spot urine test to evaluate for underlying causes. - Refer to endocrinology for further evaluation.  Chronic fatigue Chronic fatigue with excessive sleep requirements. Differential includes possible vitamin deficiencies or endocrine disorders. Previous low vitamin D and borderline low B12 noted. - Check thyroid levels including TSH, T4, and T3. - Consider B12 supplementation due to borderline low levels. - Check  iron panel to rule out anemia.  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 Symptoms consistent with menopausal state, including mood swings and temperature sensitivity. - Check hormone levels including progesterone , estradiol , FSH, and LH.  Generalized muscle aches and stiffness Generalized muscle aches and stiffness with no specific time pattern. Differential includes rheumatological conditions. - Order ANA profile to evaluate for rheumatological conditions.    Return in about 6 months (around 06/15/2024), or if symptoms worsen or fail to improve, for Chronic f/u.     I discussed the assessment and treatment plan with the patient  The patient was provided an opportunity to ask questions and all were answered. The patient agreed with the plan and demonstrated an understanding of the instructions.   The patient was advised to call back or seek an in-person evaluation if the symptoms worsen or if the condition fails to improve as anticipated.    LAURAINE LOISE BUOY, DO  [facility] [phone] (phone) [phone] (fax)  [facility]

## 2023-12-25 ENCOUNTER — Telehealth: Payer: Self-pay

## 2023-12-25 ENCOUNTER — Ambulatory Visit: Payer: Self-pay | Admitting: Family Medicine

## 2023-12-25 LAB — ANA 12PLUS PROFILE, DO ALL RDL
Anti-CCP Ab, IgG & IgA (RDL): <20 U (ref <20)
Anti-Cardiolipin Ab, IgA (RDL): <12 U/mL (ref <12)
Anti-Cardiolipin Ab, IgG (RDL): <15 GPL U/mL (ref <15)
Anti-Cardiolipin Ab, IgM (RDL): <13 [MPL'U]/mL (ref <13)
Anti-Centromere Ab (RDL): <1:40 {titer}
Anti-Chromatin Ab, IgG (RDL): <20 U (ref <20)
Anti-La (SS-B) Ab (RDL): <20 U (ref <20)
Anti-Nuclear Ab by IFA (RDL): NEGATIVE
Anti-Ro (SS-A) Ab (RDL): <20 U (ref <20)
Anti-Scl-70 Ab (RDL): <20 U (ref <20)
Anti-Sm Ab (RDL): <20 U (ref <20)
Anti-TPO Ab (RDL): <9 [IU]/mL (ref <9.0)
Anti-U1 RNP Ab (RDL): <20 U (ref <20)
Anti-dsDNA Ab by Farr(RDL): <8 [IU]/mL (ref <8.0)
C3 Complement (RDL): 189 mg/dL — ABNORMAL HIGH (ref 90–180)
C4 Complement (RDL): 36 mg/dL (ref 10–40)
Rheumatoid Factor by Turb RDL: <14 [IU]/mL (ref <14)

## 2023-12-25 LAB — IRON,TIBC AND FERRITIN PANEL
Ferritin: 101 ng/mL (ref 15–150)
Iron Saturation: 18 % (ref 15–55)
Iron: 60 ug/dL (ref 27–159)
Total Iron Binding Capacity: 337 ug/dL (ref 250–450)
UIBC: 277 ug/dL (ref 131–425)

## 2023-12-25 LAB — COMPREHENSIVE METABOLIC PANEL WITH GFR
ALT: 40 IU/L — ABNORMAL HIGH (ref 0–32)
AST: 30 IU/L (ref 0–40)
Albumin: 4.3 g/dL (ref 3.8–4.9)
Alkaline Phosphatase: 132 IU/L — ABNORMAL HIGH (ref 44–121)
BUN/Creatinine Ratio: 13 (ref 9–23)
BUN: 18 mg/dL (ref 6–24)
Bilirubin Total: 0.3 mg/dL (ref 0.0–1.2)
CO2: 23 mmol/L (ref 20–29)
Calcium: 9.7 mg/dL (ref 8.7–10.2)
Chloride: 97 mmol/L (ref 96–106)
Creatinine, Ser: 1.41 mg/dL — ABNORMAL HIGH (ref 0.57–1.00)
Globulin, Total: 2.5 g/dL (ref 1.5–4.5)
Glucose: 87 mg/dL (ref 70–99)
Potassium: 3.8 mmol/L (ref 3.5–5.2)
Sodium: 140 mmol/L (ref 134–144)
Total Protein: 6.8 g/dL (ref 6.0–8.5)
eGFR: 44 mL/min/1.73 — ABNORMAL LOW (ref >59)

## 2023-12-25 LAB — URINALYSIS
Bilirubin, UA: NEGATIVE
Glucose, UA: NEGATIVE
Ketones, UA: NEGATIVE
Leukocytes,UA: NEGATIVE
Nitrite, UA: NEGATIVE
Protein,UA: NEGATIVE
RBC, UA: NEGATIVE
Specific Gravity, UA: 1.021 (ref 1.005–1.030)
Urobilinogen, Ur: 0.2 mg/dL (ref 0.2–1.0)
pH, UA: 6 (ref 5.0–7.5)

## 2023-12-25 LAB — MICROALBUMIN / CREATININE URINE RATIO
Creatinine, Urine: 214.5 mg/dL
Microalb/Creat Ratio: 3 mg/g{creat} (ref 0–29)
Microalbumin, Urine: 6.3 ug/mL

## 2023-12-25 LAB — FSH/LH
FSH: 123 m[IU]/mL
LH: 72.1 m[IU]/mL

## 2023-12-25 LAB — ESTRADIOL, FREE
Estradiol, Serum, MS: 3.7 pg/mL
Free Estradiol, Percent: 1.5
Free Estradiol, Serum: 0.06 pg/mL — AB

## 2023-12-25 LAB — TSH+T4F+T3FREE
Free T4: 1.21 ng/dL (ref 0.82–1.77)
T3, Free: 2.7 pg/mL (ref 2.0–4.4)
TSH: 2.5 u[IU]/mL (ref 0.450–4.500)

## 2023-12-25 LAB — POTASSIUM, URINE, RANDOM: Potassium Urine: 62.1 mmol/L

## 2023-12-25 LAB — CHLORIDE, URINE, RANDOM: Chloride, Ur: 86 mmol/L

## 2023-12-25 LAB — PROGESTERONE: Progesterone: <0.1 ng/mL

## 2023-12-25 LAB — SODIUM, URINE, RANDOM: Sodium, Ur: 79 mmol/L

## 2023-12-25 LAB — OSMOLALITY, URINE: Osmolality, Ur: 644 mosm/kg

## 2023-12-25 LAB — UREA NITROGEN, URINE: Urea Nitrogen, Ur: 874 mg/dL

## 2023-12-25 LAB — C-REACTIVE PROTEIN: CRP: 12 mg/L — ABNORMAL HIGH (ref 0–10)

## 2023-12-25 NOTE — Telephone Encounter (Signed)
 [facility] called to say that they will need more information to proceed with referral.  The diagnosis of fatigue and ecessive thirst is not really an endocrine diagnosis.  He reviewed the labs and said he did not see anything to indicate it was endocrine.  If you want to pursue referral please place new order with additional information

## 2024-01-01 ENCOUNTER — Telehealth: Payer: Self-pay | Admitting: Family Medicine

## 2024-01-01 NOTE — Telephone Encounter (Signed)
 Dr. Kathie office staff called to say that the 3 diagnosis given for referral was not enough for Dr. Cherilyn to see this patient.    The referral has been closed.

## 2024-01-02 ENCOUNTER — Encounter: Payer: Self-pay | Admitting: Family Medicine

## 2024-01-12 ENCOUNTER — Other Ambulatory Visit: Payer: Self-pay | Admitting: Family Medicine

## 2024-01-12 DIAGNOSIS — F3341 Major depressive disorder, recurrent, in partial remission: Secondary | ICD-10-CM

## 2024-03-12 ENCOUNTER — Other Ambulatory Visit: Payer: Self-pay | Admitting: Family Medicine

## 2024-03-12 DIAGNOSIS — F3341 Major depressive disorder, recurrent, in partial remission: Secondary | ICD-10-CM

## 2024-03-13 ENCOUNTER — Encounter: Payer: Self-pay | Admitting: Family Medicine

## 2024-03-27 ENCOUNTER — Telehealth: Payer: Self-pay | Admitting: Family Medicine

## 2024-03-27 ENCOUNTER — Other Ambulatory Visit: Payer: Self-pay

## 2024-03-27 DIAGNOSIS — I10 Essential (primary) hypertension: Secondary | ICD-10-CM

## 2024-03-27 MED ORDER — TRIAMTERENE-HCTZ 37.5-25 MG PO TABS: 1.0000 | ORAL_TABLET | Freq: Every day | ORAL | 2 refills | Status: AC

## 2024-03-27 NOTE — Telephone Encounter (Signed)
Med has been sent in

## 2024-03-27 NOTE — Telephone Encounter (Signed)
 Refill Request   Pharmacy: [facility]  Medication: triamterene -hydrochlorothiazide ([name]-25) 37.5-25 MG tablet [8132]  Quantity (if provided): 90  Please send in refill request.

## 2024-04-24 ENCOUNTER — Other Ambulatory Visit: Payer: Self-pay | Admitting: Gastroenterology

## 2024-04-24 DIAGNOSIS — R748 Abnormal levels of other serum enzymes: Secondary | ICD-10-CM

## 2024-04-24 DIAGNOSIS — R7989 Other specified abnormal findings of blood chemistry: Secondary | ICD-10-CM

## 2024-05-01 ENCOUNTER — Ambulatory Visit
Admission: RE | Admit: 2024-05-01 | Discharge: 2024-05-01 | Disposition: A | Discharge status: Home / Self Care | Source: Ambulatory Visit | Attending: Gastroenterology | Admitting: Gastroenterology

## 2024-05-01 DIAGNOSIS — R7989 Other specified abnormal findings of blood chemistry: Secondary | ICD-10-CM | POA: Insufficient documentation

## 2024-05-01 DIAGNOSIS — R748 Abnormal levels of other serum enzymes: Secondary | ICD-10-CM | POA: Insufficient documentation

## 2024-05-06 ENCOUNTER — Ambulatory Visit: Payer: Self-pay | Admitting: Gastroenterology

## 2024-05-23 ENCOUNTER — Ambulatory Visit: Admitting: Family Medicine

## 2024-05-23 DIAGNOSIS — Z23 Encounter for immunization: Secondary | ICD-10-CM | POA: Diagnosis not present

## 2024-05-23 NOTE — Progress Notes (Signed)
 Patient here for nurse visit for immunization only.  Patient not seen or evaluated by physician.

## 2024-05-23 NOTE — Progress Notes (Signed)
" °  Patient is here for Hepatitis B vaccine, was administered in left deltoid with a 25 g x 1 inch needle.  Patient did not see the provider on me for her vaccine.  Patient tolerated vaccine very well.       "

## 2024-06-20 ENCOUNTER — Ambulatory Visit: Admitting: Family Medicine
